# Patient Record
Sex: Female | Born: 1935 | Race: White | Hispanic: No | Marital: Married | Smoking: Never smoker
Health system: Southern US, Community
[De-identification: ages and names within clinical notes are randomized; demographics above are authoritative.]

## PROBLEM LIST (undated history)

## (undated) DIAGNOSIS — E079 Disorder of thyroid, unspecified: Secondary | ICD-10-CM

## (undated) DIAGNOSIS — I471 Supraventricular tachycardia: Secondary | ICD-10-CM

## (undated) DIAGNOSIS — R079 Chest pain, unspecified: Secondary | ICD-10-CM

## (undated) DIAGNOSIS — I10 Essential (primary) hypertension: Secondary | ICD-10-CM

## (undated) DIAGNOSIS — I517 Cardiomegaly: Secondary | ICD-10-CM

## (undated) DIAGNOSIS — R002 Palpitations: Secondary | ICD-10-CM

## (undated) DIAGNOSIS — N189 Chronic kidney disease, unspecified: Secondary | ICD-10-CM

## (undated) DIAGNOSIS — M1990 Unspecified osteoarthritis, unspecified site: Secondary | ICD-10-CM

## (undated) DIAGNOSIS — I491 Atrial premature depolarization: Secondary | ICD-10-CM

## (undated) DIAGNOSIS — N289 Disorder of kidney and ureter, unspecified: Secondary | ICD-10-CM

## (undated) DIAGNOSIS — E785 Hyperlipidemia, unspecified: Secondary | ICD-10-CM

## (undated) HISTORY — DX: Atrial premature depolarization: I49.1

## (undated) HISTORY — PX: JOINT REPLACEMENT: SHX530

## (undated) HISTORY — DX: Supraventricular tachycardia: I47.1

## (undated) HISTORY — DX: Hyperlipidemia, unspecified: E78.5

## (undated) HISTORY — PX: ABDOMINAL HYSTERECTOMY: SHX81

## (undated) HISTORY — DX: Chronic kidney disease, unspecified: N18.9

## (undated) HISTORY — DX: Cardiomegaly: I51.7

## (undated) HISTORY — DX: Unspecified osteoarthritis, unspecified site: M19.90

## (undated) HISTORY — DX: Chest pain, unspecified: R07.9

## (undated) HISTORY — PX: TOTAL KNEE ARTHROPLASTY: SHX125

## (undated) HISTORY — DX: Disorder of thyroid, unspecified: E07.9

## (undated) HISTORY — DX: Palpitations: R00.2

---

## 2000-07-23 ENCOUNTER — Other Ambulatory Visit: Admission: RE | Admit: 2000-07-23 | Discharge: 2000-07-23 | Payer: Self-pay | Admitting: Gynecology

## 2003-07-23 ENCOUNTER — Ambulatory Visit (HOSPITAL_COMMUNITY): Admission: RE | Admit: 2003-07-23 | Discharge: 2003-07-23 | Payer: Self-pay | Admitting: Orthopedic Surgery

## 2004-01-20 ENCOUNTER — Encounter (INDEPENDENT_AMBULATORY_CARE_PROVIDER_SITE_OTHER): Payer: Self-pay | Admitting: Specialist

## 2004-01-20 ENCOUNTER — Ambulatory Visit (HOSPITAL_COMMUNITY): Admission: RE | Admit: 2004-01-20 | Discharge: 2004-01-20 | Payer: Self-pay | Admitting: Gynecology

## 2004-10-23 ENCOUNTER — Other Ambulatory Visit: Admission: RE | Admit: 2004-10-23 | Discharge: 2004-10-23 | Payer: Self-pay | Admitting: Gynecology

## 2009-12-12 ENCOUNTER — Inpatient Hospital Stay (HOSPITAL_COMMUNITY): Admission: RE | Admit: 2009-12-12 | Discharge: 2009-12-14 | Payer: Self-pay | Admitting: Orthopedic Surgery

## 2010-07-28 LAB — CROSSMATCH

## 2010-07-28 LAB — CBC
HCT: 25.7 % — ABNORMAL LOW (ref 36.0–46.0)
HCT: 29.2 % — ABNORMAL LOW (ref 36.0–46.0)
HCT: 32.1 % — ABNORMAL LOW (ref 36.0–46.0)
Hemoglobin: 9.3 g/dL — ABNORMAL LOW (ref 12.0–15.0)
MCH: 29.2 pg (ref 26.0–34.0)
MCH: 29.6 pg (ref 26.0–34.0)
MCHC: 32.3 g/dL (ref 30.0–36.0)
MCHC: 33.3 g/dL (ref 30.0–36.0)
MCV: 91.5 fL (ref 78.0–100.0)
MCV: 91.8 fL (ref 78.0–100.0)
RBC: 3.19 MIL/uL — ABNORMAL LOW (ref 3.87–5.11)
RDW: 12.3 % (ref 11.5–15.5)
RDW: 13.3 % (ref 11.5–15.5)
WBC: 10.1 10*3/uL (ref 4.0–10.5)

## 2010-07-28 LAB — BASIC METABOLIC PANEL
BUN: 21 mg/dL (ref 6–23)
CO2: 25 mEq/L (ref 19–32)
Chloride: 110 mEq/L (ref 96–112)
GFR calc Af Amer: 28 mL/min — ABNORMAL LOW (ref >60)
GFR calc non Af Amer: 25 mL/min — ABNORMAL LOW (ref >60)
Glucose, Bld: 101 mg/dL — ABNORMAL HIGH (ref 70–99)
Potassium: 4.5 mEq/L (ref 3.5–5.1)
Potassium: 4.6 mEq/L (ref 3.5–5.1)
Sodium: 139 mEq/L (ref 135–145)

## 2010-07-29 LAB — COMPREHENSIVE METABOLIC PANEL
AST: 24 U/L (ref 0–37)
Albumin: 3.6 g/dL (ref 3.5–5.2)
Calcium: 9.8 mg/dL (ref 8.4–10.5)
Chloride: 109 mEq/L (ref 96–112)
Creatinine, Ser: 2.01 mg/dL — ABNORMAL HIGH (ref 0.4–1.2)
GFR calc Af Amer: 29 mL/min — ABNORMAL LOW (ref >60)
Total Protein: 6.5 g/dL (ref 6.0–8.3)

## 2010-07-29 LAB — URINALYSIS, ROUTINE W REFLEX MICROSCOPIC
Glucose, UA: NEGATIVE mg/dL
Leukocytes, UA: NEGATIVE
Nitrite: NEGATIVE
Protein, ur: 30 mg/dL — AB
pH: 6 (ref 5.0–8.0)

## 2010-07-29 LAB — URINE CULTURE: Culture: NO GROWTH

## 2010-07-29 LAB — CBC
MCH: 30.7 pg (ref 26.0–34.0)
MCHC: 33.7 g/dL (ref 30.0–36.0)
Platelets: 267 10*3/uL (ref 150–400)
RBC: 4.21 MIL/uL (ref 3.87–5.11)

## 2010-07-29 LAB — SURGICAL PCR SCREEN
MRSA, PCR: POSITIVE — AB
Staphylococcus aureus: POSITIVE — AB

## 2010-07-29 LAB — URINE MICROSCOPIC-ADD ON

## 2010-07-29 LAB — DIFFERENTIAL
Eosinophils Relative: 3 % (ref 0–5)
Lymphocytes Relative: 33 % (ref 12–46)
Lymphs Abs: 2.3 10*3/uL (ref 0.7–4.0)
Monocytes Absolute: 0.6 10*3/uL (ref 0.1–1.0)
Monocytes Relative: 9 % (ref 3–12)

## 2010-09-29 NOTE — Op Note (Signed)
NAME:  DENNY, MCDOWALL                       ACCOUNT NO.:  [id number]   MEDICAL RECORD NO.:  [id number]                   PATIENT TYPE:  AMB   LOCATION:  DAY                                  FACILITY:  [facility]   PHYSICIAN:  Gaynelle Arabian, M.D.                 DATE OF BIRTH:  22-Feb-1933   DATE OF PROCEDURE:  07/23/2003  DATE OF DISCHARGE:                                 OPERATIVE REPORT   PREOPERATIVE DIAGNOSES:  Right knee medial and lateral meniscal tears.   POSTOPERATIVE DIAGNOSES:  Right knee medial and lateral meniscal tears.   PROCEDURE:  Right knee arthroscopy with medial and lateral meniscal  debridement.   SURGEON:  Gaynelle Arabian, M.D.   ANESTHESIA:  Local with MAC.   ESTIMATED BLOOD LOSS:  Minimal.   DRAINS:  None.   COMPLICATIONS:  None.   CONDITION:  Stable to recovery.   BRIEF CLINICAL NOTE:  Ms. Maricle is a 77 year old female with a long  history of right knee pain and mechanical symptoms.  His exam and history  were consistent with lateral meniscal tear confirmed by MRI. She presents  now for arthroscopy and debridement.   DESCRIPTION OF PROCEDURE:  After successful administration of local with MAC  anesthetic, a tourniquet was placed high on the right thigh and right lower  extremity prepped and draped in the usual sterile fashion. I did infiltrate  the suprameatal portal with about 10 mL of 1% Xylocaine.  This incision was  made superomedially and inflow cannula passed in through the joint.  An  inferolateral incision was made and the camera passed into the joint.  Arthroscopic visualization proceeds. The suprapatellar area shows moderate  synovitis but no loose bodies.  The undersurface of the patella showed some  grade 2 change but no focal chondral defect. The trochlea had some grade 1  change but again no focal chondral defects. The medial and lateral gutters  showed some synovitis but no loose bodies.  Flexion and valgus force is  applied to the  knee and the medial compartment is entered. The spinal needle  was used to localize the inferomedial portal, small incisions made, dilator  placed and then a probe placed. There is a tear in the medial meniscus and  the posterior horn which is unstable. It is debrided back to a stable base  with baskets and a 4.2 mm shaver. There is a small area of chondral  delamination about 5 x 5 mm in the medial femoral condyle and I debrided  that back to a stable base. The intercondylar notch was visualized, the ACL  was attenuated and frayed but is not torn.  The lateral compartment is  entered and there is a large tear in the body and posterior horn of the  lateral meniscus. There is also some significant chondromalacia on the  lateral tibial plateau with focal area of a grade 4 defect. There is some  thinning in the lateral femoral condyle but no focal chondral defects. I  debrided the meniscal tear back to a stable base with baskets and the 4.2  shaver. I probed it and it was found to be stable.  The arthroscopic  equipment is then removed from the inferior portals which are closed with  interrupted 4-0 nylon.  20  mL of 0.25% Marcaine with epinephrine are injected through the inflow  cannula and then that cannula was removed and the portal closed with nylon.  A bulky sterile dressing was applied and she was awakened and transported to  recovery in stable condition.                                               Gaynelle Arabian, M.D.    FA/MEDQ  D:  07/23/2003  T:  07/23/2003  Job:  [id number]

## 2010-09-29 NOTE — Op Note (Signed)
NAME:  Morgan Rosales, Morgan Rosales                       ACCOUNT NO.:  [id number]   MEDICAL RECORD NO.:  [id number]                   PATIENT TYPE:  AMB   LOCATION:  SDC                                  FACILITY:  [facility]   PHYSICIAN:  Barbaraann Rondo, M.D.                DATE OF BIRTH:  19-Oct-1932   DATE OF PROCEDURE:  01/20/2004  DATE OF DISCHARGE:                                 OPERATIVE REPORT   PREOPERATIVE DIAGNOSIS:  Vulvar lesion, unknown etiology, rule out  carcinoma.   POSTOPERATIVE DIAGNOSIS:  Vulvar lesion, unknown etiology, rule out  carcinoma.   OPERATION PERFORMED:  Excisional biopsy of the vulva.   SURGEON:  Barbaraann Rondo, M.D.   ANESTHESIA:  MAC and local.   DESCRIPTION OF PROCEDURE:  The patient was placed in the lithotomy position  and sedated.  1% lidocaine was injected locally in the area of the lesion.  Excisional biopsy was done to remove the lesion that was visible right at  the fourchette.  A wide margin was excised in order to get it all removed in  toto, and sent to pathology for examination.  The defect was closed using  interrupted 4-0 Vicryl sutures.  The estimated blood loss was minimal, about  10 mL.  The patient tolerated the procedure well and was removed to recovery  in good condition.                                               Barbaraann Rondo, M.D.    WSB/MEDQ  D:  01/20/2004  T:  01/20/2004  Job:  [id number]

## 2014-07-06 DIAGNOSIS — E559 Vitamin D deficiency, unspecified: Secondary | ICD-10-CM | POA: Diagnosis not present

## 2014-07-06 DIAGNOSIS — N184 Chronic kidney disease, stage 4 (severe): Secondary | ICD-10-CM | POA: Diagnosis not present

## 2014-07-06 DIAGNOSIS — I10 Essential (primary) hypertension: Secondary | ICD-10-CM | POA: Diagnosis not present

## 2014-07-06 DIAGNOSIS — R399 Unspecified symptoms and signs involving the genitourinary system: Secondary | ICD-10-CM | POA: Diagnosis not present

## 2014-07-06 DIAGNOSIS — E039 Hypothyroidism, unspecified: Secondary | ICD-10-CM | POA: Diagnosis not present

## 2014-07-06 DIAGNOSIS — E785 Hyperlipidemia, unspecified: Secondary | ICD-10-CM | POA: Diagnosis not present

## 2014-07-15 DIAGNOSIS — N261 Atrophy of kidney (terminal): Secondary | ICD-10-CM | POA: Diagnosis not present

## 2014-07-15 DIAGNOSIS — N184 Chronic kidney disease, stage 4 (severe): Secondary | ICD-10-CM | POA: Diagnosis not present

## 2014-07-30 DIAGNOSIS — R42 Dizziness and giddiness: Secondary | ICD-10-CM | POA: Diagnosis not present

## 2014-07-30 DIAGNOSIS — F329 Major depressive disorder, single episode, unspecified: Secondary | ICD-10-CM | POA: Diagnosis not present

## 2014-07-30 DIAGNOSIS — M5136 Other intervertebral disc degeneration, lumbar region: Secondary | ICD-10-CM | POA: Diagnosis not present

## 2014-07-30 DIAGNOSIS — M5134 Other intervertebral disc degeneration, thoracic region: Secondary | ICD-10-CM | POA: Diagnosis not present

## 2014-07-30 DIAGNOSIS — R2689 Other abnormalities of gait and mobility: Secondary | ICD-10-CM | POA: Diagnosis not present

## 2014-08-25 DIAGNOSIS — H5203 Hypermetropia, bilateral: Secondary | ICD-10-CM | POA: Diagnosis not present

## 2014-08-25 DIAGNOSIS — H3531 Nonexudative age-related macular degeneration: Secondary | ICD-10-CM | POA: Diagnosis not present

## 2014-08-26 DIAGNOSIS — M4316 Spondylolisthesis, lumbar region: Secondary | ICD-10-CM | POA: Diagnosis not present

## 2014-08-26 DIAGNOSIS — M5136 Other intervertebral disc degeneration, lumbar region: Secondary | ICD-10-CM | POA: Diagnosis not present

## 2014-08-26 DIAGNOSIS — M5134 Other intervertebral disc degeneration, thoracic region: Secondary | ICD-10-CM | POA: Diagnosis not present

## 2014-09-08 DIAGNOSIS — M545 Low back pain: Secondary | ICD-10-CM | POA: Diagnosis not present

## 2014-09-14 DIAGNOSIS — M9972 Connective tissue and disc stenosis of intervertebral foramina of thoracic region: Secondary | ICD-10-CM | POA: Diagnosis not present

## 2014-09-14 DIAGNOSIS — M4804 Spinal stenosis, thoracic region: Secondary | ICD-10-CM | POA: Diagnosis not present

## 2014-09-14 DIAGNOSIS — M5134 Other intervertebral disc degeneration, thoracic region: Secondary | ICD-10-CM | POA: Diagnosis not present

## 2014-09-14 DIAGNOSIS — M5124 Other intervertebral disc displacement, thoracic region: Secondary | ICD-10-CM | POA: Diagnosis not present

## 2014-09-14 DIAGNOSIS — M47814 Spondylosis without myelopathy or radiculopathy, thoracic region: Secondary | ICD-10-CM | POA: Diagnosis not present

## 2014-09-15 DIAGNOSIS — M545 Low back pain: Secondary | ICD-10-CM | POA: Diagnosis not present

## 2014-09-29 DIAGNOSIS — N184 Chronic kidney disease, stage 4 (severe): Secondary | ICD-10-CM | POA: Diagnosis not present

## 2014-10-21 DIAGNOSIS — E039 Hypothyroidism, unspecified: Secondary | ICD-10-CM | POA: Diagnosis not present

## 2014-10-21 DIAGNOSIS — E785 Hyperlipidemia, unspecified: Secondary | ICD-10-CM | POA: Diagnosis not present

## 2014-10-21 DIAGNOSIS — Z9181 History of falling: Secondary | ICD-10-CM | POA: Diagnosis not present

## 2014-10-21 DIAGNOSIS — N184 Chronic kidney disease, stage 4 (severe): Secondary | ICD-10-CM | POA: Diagnosis not present

## 2014-10-21 DIAGNOSIS — Z139 Encounter for screening, unspecified: Secondary | ICD-10-CM | POA: Diagnosis not present

## 2014-10-21 DIAGNOSIS — I10 Essential (primary) hypertension: Secondary | ICD-10-CM | POA: Diagnosis not present

## 2014-10-21 DIAGNOSIS — E559 Vitamin D deficiency, unspecified: Secondary | ICD-10-CM | POA: Diagnosis not present

## 2014-10-21 DIAGNOSIS — Z1389 Encounter for screening for other disorder: Secondary | ICD-10-CM | POA: Diagnosis not present

## 2015-01-27 DIAGNOSIS — E785 Hyperlipidemia, unspecified: Secondary | ICD-10-CM | POA: Diagnosis not present

## 2015-01-27 DIAGNOSIS — E039 Hypothyroidism, unspecified: Secondary | ICD-10-CM | POA: Diagnosis not present

## 2015-01-27 DIAGNOSIS — N184 Chronic kidney disease, stage 4 (severe): Secondary | ICD-10-CM | POA: Diagnosis not present

## 2015-01-27 DIAGNOSIS — E559 Vitamin D deficiency, unspecified: Secondary | ICD-10-CM | POA: Diagnosis not present

## 2015-01-27 DIAGNOSIS — I10 Essential (primary) hypertension: Secondary | ICD-10-CM | POA: Diagnosis not present

## 2015-01-27 DIAGNOSIS — R3989 Other symptoms and signs involving the genitourinary system: Secondary | ICD-10-CM | POA: Diagnosis not present

## 2015-02-07 DIAGNOSIS — N184 Chronic kidney disease, stage 4 (severe): Secondary | ICD-10-CM | POA: Diagnosis not present

## 2015-02-22 DIAGNOSIS — I491 Atrial premature depolarization: Secondary | ICD-10-CM | POA: Diagnosis not present

## 2015-02-22 DIAGNOSIS — I517 Cardiomegaly: Secondary | ICD-10-CM

## 2015-02-22 DIAGNOSIS — R002 Palpitations: Secondary | ICD-10-CM | POA: Diagnosis not present

## 2015-02-22 DIAGNOSIS — I10 Essential (primary) hypertension: Secondary | ICD-10-CM | POA: Diagnosis not present

## 2015-02-22 HISTORY — DX: Cardiomegaly: I51.7

## 2015-02-25 DIAGNOSIS — R002 Palpitations: Secondary | ICD-10-CM | POA: Diagnosis not present

## 2015-02-28 DIAGNOSIS — R002 Palpitations: Secondary | ICD-10-CM | POA: Diagnosis not present

## 2015-03-24 DIAGNOSIS — Z23 Encounter for immunization: Secondary | ICD-10-CM | POA: Diagnosis not present

## 2015-03-24 DIAGNOSIS — N184 Chronic kidney disease, stage 4 (severe): Secondary | ICD-10-CM | POA: Diagnosis not present

## 2015-03-24 DIAGNOSIS — E559 Vitamin D deficiency, unspecified: Secondary | ICD-10-CM | POA: Diagnosis not present

## 2015-03-30 DIAGNOSIS — H04123 Dry eye syndrome of bilateral lacrimal glands: Secondary | ICD-10-CM | POA: Diagnosis not present

## 2015-03-30 DIAGNOSIS — H3530 Unspecified macular degeneration: Secondary | ICD-10-CM | POA: Diagnosis not present

## 2015-03-30 DIAGNOSIS — H02045 Spastic entropion of left lower eyelid: Secondary | ICD-10-CM | POA: Diagnosis not present

## 2015-03-30 DIAGNOSIS — H353130 Nonexudative age-related macular degeneration, bilateral, stage unspecified: Secondary | ICD-10-CM | POA: Diagnosis not present

## 2015-04-28 DIAGNOSIS — I10 Essential (primary) hypertension: Secondary | ICD-10-CM | POA: Diagnosis not present

## 2015-04-28 DIAGNOSIS — E559 Vitamin D deficiency, unspecified: Secondary | ICD-10-CM | POA: Diagnosis not present

## 2015-04-28 DIAGNOSIS — E039 Hypothyroidism, unspecified: Secondary | ICD-10-CM | POA: Diagnosis not present

## 2015-04-28 DIAGNOSIS — Z1389 Encounter for screening for other disorder: Secondary | ICD-10-CM | POA: Diagnosis not present

## 2015-04-28 DIAGNOSIS — E785 Hyperlipidemia, unspecified: Secondary | ICD-10-CM | POA: Diagnosis not present

## 2015-04-28 DIAGNOSIS — N184 Chronic kidney disease, stage 4 (severe): Secondary | ICD-10-CM | POA: Diagnosis not present

## 2015-04-29 DIAGNOSIS — H353130 Nonexudative age-related macular degeneration, bilateral, stage unspecified: Secondary | ICD-10-CM | POA: Diagnosis not present

## 2015-04-29 DIAGNOSIS — H3530 Unspecified macular degeneration: Secondary | ICD-10-CM | POA: Diagnosis not present

## 2015-04-29 DIAGNOSIS — H04123 Dry eye syndrome of bilateral lacrimal glands: Secondary | ICD-10-CM | POA: Diagnosis not present

## 2015-04-29 DIAGNOSIS — H02045 Spastic entropion of left lower eyelid: Secondary | ICD-10-CM | POA: Diagnosis not present

## 2015-05-04 DIAGNOSIS — R2689 Other abnormalities of gait and mobility: Secondary | ICD-10-CM | POA: Diagnosis not present

## 2015-05-04 DIAGNOSIS — F419 Anxiety disorder, unspecified: Secondary | ICD-10-CM | POA: Diagnosis not present

## 2015-05-04 DIAGNOSIS — Z6825 Body mass index (BMI) 25.0-25.9, adult: Secondary | ICD-10-CM | POA: Diagnosis not present

## 2015-05-04 DIAGNOSIS — S60511A Abrasion of right hand, initial encounter: Secondary | ICD-10-CM | POA: Diagnosis not present

## 2015-05-24 ENCOUNTER — Ambulatory Visit: Payer: Medicare Other | Admitting: Neurology

## 2015-05-25 ENCOUNTER — Encounter: Payer: Self-pay | Admitting: Neurology

## 2015-07-12 DIAGNOSIS — N390 Urinary tract infection, site not specified: Secondary | ICD-10-CM | POA: Diagnosis not present

## 2015-07-12 DIAGNOSIS — E039 Hypothyroidism, unspecified: Secondary | ICD-10-CM | POA: Diagnosis not present

## 2015-07-12 DIAGNOSIS — M545 Low back pain: Secondary | ICD-10-CM | POA: Diagnosis not present

## 2015-07-12 DIAGNOSIS — R61 Generalized hyperhidrosis: Secondary | ICD-10-CM | POA: Diagnosis not present

## 2015-08-02 DIAGNOSIS — E559 Vitamin D deficiency, unspecified: Secondary | ICD-10-CM | POA: Diagnosis not present

## 2015-08-02 DIAGNOSIS — E039 Hypothyroidism, unspecified: Secondary | ICD-10-CM | POA: Diagnosis not present

## 2015-08-02 DIAGNOSIS — I10 Essential (primary) hypertension: Secondary | ICD-10-CM | POA: Diagnosis not present

## 2015-08-02 DIAGNOSIS — E785 Hyperlipidemia, unspecified: Secondary | ICD-10-CM | POA: Diagnosis not present

## 2015-08-02 DIAGNOSIS — N184 Chronic kidney disease, stage 4 (severe): Secondary | ICD-10-CM | POA: Diagnosis not present

## 2015-08-03 DIAGNOSIS — H04123 Dry eye syndrome of bilateral lacrimal glands: Secondary | ICD-10-CM | POA: Diagnosis not present

## 2015-08-03 DIAGNOSIS — H353130 Nonexudative age-related macular degeneration, bilateral, stage unspecified: Secondary | ICD-10-CM | POA: Diagnosis not present

## 2015-09-28 DIAGNOSIS — R002 Palpitations: Secondary | ICD-10-CM | POA: Diagnosis not present

## 2015-10-07 DIAGNOSIS — M25551 Pain in right hip: Secondary | ICD-10-CM | POA: Diagnosis not present

## 2015-10-07 DIAGNOSIS — M25461 Effusion, right knee: Secondary | ICD-10-CM | POA: Diagnosis not present

## 2015-10-07 DIAGNOSIS — Z96651 Presence of right artificial knee joint: Secondary | ICD-10-CM | POA: Diagnosis not present

## 2015-10-07 DIAGNOSIS — M1611 Unilateral primary osteoarthritis, right hip: Secondary | ICD-10-CM | POA: Diagnosis not present

## 2015-10-27 DIAGNOSIS — R002 Palpitations: Secondary | ICD-10-CM | POA: Diagnosis not present

## 2015-11-08 DIAGNOSIS — I10 Essential (primary) hypertension: Secondary | ICD-10-CM | POA: Diagnosis not present

## 2015-11-08 DIAGNOSIS — Z9181 History of falling: Secondary | ICD-10-CM | POA: Diagnosis not present

## 2015-11-08 DIAGNOSIS — E663 Overweight: Secondary | ICD-10-CM | POA: Diagnosis not present

## 2015-11-08 DIAGNOSIS — E039 Hypothyroidism, unspecified: Secondary | ICD-10-CM | POA: Diagnosis not present

## 2015-11-08 DIAGNOSIS — N184 Chronic kidney disease, stage 4 (severe): Secondary | ICD-10-CM | POA: Diagnosis not present

## 2015-11-08 DIAGNOSIS — E785 Hyperlipidemia, unspecified: Secondary | ICD-10-CM | POA: Diagnosis not present

## 2015-11-17 DIAGNOSIS — M79606 Pain in leg, unspecified: Secondary | ICD-10-CM | POA: Diagnosis not present

## 2016-01-06 ENCOUNTER — Encounter (HOSPITAL_COMMUNITY): Payer: Self-pay | Admitting: *Deleted

## 2016-01-06 ENCOUNTER — Emergency Department (HOSPITAL_COMMUNITY)
Admission: EM | Admit: 2016-01-06 | Discharge: 2016-01-07 | Disposition: A | Discharge status: Home / Self Care | Payer: Medicare Other | Attending: Emergency Medicine | Admitting: Emergency Medicine

## 2016-01-06 DIAGNOSIS — Y929 Unspecified place or not applicable: Secondary | ICD-10-CM | POA: Insufficient documentation

## 2016-01-06 DIAGNOSIS — Y999 Unspecified external cause status: Secondary | ICD-10-CM | POA: Diagnosis not present

## 2016-01-06 DIAGNOSIS — Z23 Encounter for immunization: Secondary | ICD-10-CM | POA: Insufficient documentation

## 2016-01-06 DIAGNOSIS — N3946 Mixed incontinence: Secondary | ICD-10-CM | POA: Diagnosis not present

## 2016-01-06 DIAGNOSIS — W268XXA Contact with other sharp object(s), not elsewhere classified, initial encounter: Secondary | ICD-10-CM | POA: Insufficient documentation

## 2016-01-06 DIAGNOSIS — Y9301 Activity, walking, marching and hiking: Secondary | ICD-10-CM | POA: Diagnosis not present

## 2016-01-06 DIAGNOSIS — I10 Essential (primary) hypertension: Secondary | ICD-10-CM | POA: Diagnosis not present

## 2016-01-06 DIAGNOSIS — S81811A Laceration without foreign body, right lower leg, initial encounter: Secondary | ICD-10-CM | POA: Insufficient documentation

## 2016-01-06 DIAGNOSIS — Z6826 Body mass index (BMI) 26.0-26.9, adult: Secondary | ICD-10-CM | POA: Diagnosis not present

## 2016-01-06 HISTORY — DX: Essential (primary) hypertension: I10

## 2016-01-06 HISTORY — DX: Disorder of thyroid, unspecified: E07.9

## 2016-01-06 HISTORY — DX: Disorder of kidney and ureter, unspecified: N28.9

## 2016-01-06 MED ORDER — TETANUS-DIPHTH-ACELL PERTUSSIS 5-2.5-18.5 LF-MCG/0.5 IM SUSP
0.5000 mL | Freq: Once | INTRAMUSCULAR | Status: AC
  Administered 2016-01-07: 0.5 mL via INTRAMUSCULAR
  Filled 2016-01-06: qty 0.5

## 2016-01-06 NOTE — Discharge Instructions (Signed)
Please read and follow all provided instructions.  Your diagnoses today include:  1. Laceration of leg, right, initial encounter    Tests performed today include: Vital signs. See below for your results today.   Medications prescribed:   Take any prescribed medications only as directed.   Home care instructions:  Follow any educational materials and wound care instructions contained in this packet.   You may shower and wash the area with soap and water, just be sure to pat the area dry and not rub over the stitches. Do no put your stiches underwater (in a bath, pool, or lake). Getting stiches wet can slow down healing and increase your chances of getting an infection. You may apply Bacitracin or Neosporin twice a day for 7 days, and keep the ara clean with  bandage or gauze. Do not apply alcohol or hydrogen peroxide. Cover the area if it draining or weeping.   Follow-up instructions: Suture Removal: Return to the Emergency Department or see your primary care care doctor in 8-10 days for a recheck of your wound and removal of your sutures or staples.    Return instructions:  Return to the Emergency Department if you have: Fever Worsening pain Worsening swelling of the wound Pus draining from the wound Redness of the skin that moves away from the wound, especially if it streaks away from the affected area  Any other emergent concerns  Your vital signs today were: BP 188/71 (BP Location: Right Arm)    Pulse 66    Temp 97.8 F (36.6 C) (Oral)    Resp 18    Ht 5\' 2"  (1.575 m)    Wt 61.7 kg    SpO2 99%    BMI 24.87 kg/m  If your blood pressure (BP) was elevated above 135/85 this visit, please have this repeated by your doctor within one month. --------------

## 2016-01-06 NOTE — ED Triage Notes (Signed)
Pt states she cut her lateral lower leg at 8PM today on a screen door. Pt is not sure when she last had tetanus shot. Wound is bandaged, bleeding controlled.

## 2016-01-06 NOTE — ED Provider Notes (Signed)
[facility] DEPT Provider Note   CSN: [id number] Arrival date & time: 01/06/16  2107  By signing my name below, I, Reola Mosher, attest that this documentation has been prepared under the direction and in the presence of Shary Decamp, PA-C.  Electronically Signed: Reola Mosher, ED Scribe. 01/06/16. 10:45 PM.  History   Chief Complaint Chief Complaint  Patient presents with  . Laceration   Patient gave verbal permission to utilize photo for medical documentation only. The image was not stored on any personal device.  The history is provided by the patient. No language interpreter was used.   HPI Comments: Morgan Rosales is a 82 y.o. female with a PMHx of HTN, renal disorder, and thyroid disease, who presents to the Emergency Department complaining of sudden onset laceration to the lateral lower right leg that occurred approximately 2.5 hours PTA. Bleeding is controlled w/ pressure. Pt reports that she was walking out of a metal screen door when the bottom edge of the door came across her leg, sustaining her laceration. She is not currently on anticoagulants. Pt applied a pressure dressing prior to coming into the ED, but no other treatments were tried. She denies numbness, or any other associated symptoms. Tetanus is not UTD.   Past Medical History:  Diagnosis Date  . Hypertension   . Renal disorder   . Thyroid disease    There are no active problems to display for this patient.  Past Surgical History:  Procedure Laterality Date  . ABDOMINAL HYSTERECTOMY    . JOINT REPLACEMENT     OB History    No data available     Home Medications    Prior to Admission medications   Not on File   Family History No family history on file.  Social History Social History  Substance Use Topics  . Smoking status: Never Smoker  . Smokeless tobacco: Never Used  . Alcohol use No   Allergies   Codeine and Sulfa antibiotics  Review of Systems Review of Systems A  complete 10 system review of systems was obtained and all systems are negative except as noted in the HPI and PMH.   Physical Exam Updated Vital Signs BP 188/71 (BP Location: Right Arm)   Pulse 66   Temp 97.8 F (36.6 C) (Oral)   Resp 18   Ht 5\' 2"  (1.575 m)   Wt 136 lb (61.7 kg)   SpO2 99%   BMI 24.87 kg/m   Physical Exam  Constitutional: She appears well-developed and well-nourished.  HENT:  Head: Normocephalic.  Eyes: Conjunctivae are normal.  Cardiovascular: Normal rate.   Pulmonary/Chest: Effort normal. No respiratory distress.  Abdominal: She exhibits no distension.  Musculoskeletal: Normal range of motion.  Neurological: She is alert.  Skin: Skin is warm and dry.  See image.   Psychiatric: She has a normal mood and affect. Her behavior is normal.  Nursing note and vitals reviewed.    ED Treatments / Results  DIAGNOSTIC STUDIES: Oxygen Saturation is 99% on RA, normal by my interpretation.   COORDINATION OF CARE: 10:38 PM-Discussed next steps with pt and family at bedside including laceration repair. Pt verbalized understanding and is agreeable with the plan.   Labs (all labs ordered are listed, but only abnormal results are displayed) Labs Reviewed - No data to display  Procedures Procedures  PROCEDURE: LACERATION REPAIR  Performed by Shary Decamp, PA-C Consent: Informed consent, after discussion of the risks, benefits, and alternatives to the procedure, was obtained  Location: right lateral lower leg Length: 6cm Complexity: simple interrupted  Description: simple Distal CMS: Normal. No deficits. Neurovascularly intact. Anesthesia: Lido w/ epi 57mL Preparation: The wound was cleaned with betadine solution and irrigated with saline. The area was prepped and draped in the usual sterile fashion.  Exploration: The wound was explored and no foreign bodies were found. Procedure: The skin was closed with 4-0 proline. There was good approximation, In total, 10  were used.  Post-Procedure: Good closure and hemostasis. The patient tolerated the procedure well and there were no complications. CSM remain intact. Post procedure dressing applied by RN.    Medications Ordered in ED Medications - No data to display  Initial Impression / Assessment and Plan / ED Course  I have reviewed the triage vital signs and the nursing notes.  Pertinent labs & imaging results that were available during my care of the patient were reviewed by me and considered in my medical decision making (see chart for details).  Clinical Course     Final Clinical Impressions(s) / ED Diagnoses  I have reviewed the relevant previous healthcare records. I obtained HPI from historian. Patient discussed with supervising physician  ED Course:  Assessment: Patient is a 82yo female that presents with laceration to right lower leg. Tdap booster given. Pressure irrigation performed. Bottom of the wound visualized with bleeding controled. Laceration occurred < 8 hours prior to repair which was well tolerated. Pt has no co morbidities to effect normal wound healing. Discussed suture home care w pt and answered questions. Pt to f-u for wound check and suture removal in 8-10 days. Pt is hemodynamically stable w no complaints prior to dc.   Disposition/Plan:  DC Home Additional Verbal discharge instructions given and discussed with patient.  Pt Instructed to f/u with PCP in the next week for evaluation and treatment of symptoms. Return precautions given Pt acknowledges and agrees with plan  Supervising Physician Lacretia Leigh, MD   Final diagnoses:  Laceration of leg, right, initial encounter   New Prescriptions New Prescriptions   No medications on file    I personally performed the services described in this documentation, which was scribed in my presence. The recorded information has been reviewed and is accurate.     Shary Decamp, PA-C 01/06/16 2322    Lacretia Leigh,  MD 01/08/16 [phone]

## 2016-01-06 NOTE — ED Notes (Signed)
PA at bedside.

## 2016-01-06 NOTE — ED Notes (Signed)
Suture cart at bedside 

## 2016-01-07 MED ORDER — LIDOCAINE-EPINEPHRINE 2 %-1:100000 IJ SOLN
20.0000 mL | Freq: Once | INTRAMUSCULAR | Status: AC
  Administered 2016-01-07: 1 mL via INTRADERMAL

## 2016-01-07 NOTE — ED Notes (Signed)
Patient d/c'd self care.  F/U reviewed.  Patient verbalized understanding. 

## 2016-01-24 DIAGNOSIS — Z79899 Other long term (current) drug therapy: Secondary | ICD-10-CM | POA: Diagnosis not present

## 2016-01-24 DIAGNOSIS — S81801A Unspecified open wound, right lower leg, initial encounter: Secondary | ICD-10-CM | POA: Diagnosis not present

## 2016-02-07 DIAGNOSIS — C44722 Squamous cell carcinoma of skin of right lower limb, including hip: Secondary | ICD-10-CM | POA: Diagnosis not present

## 2016-02-13 DIAGNOSIS — N184 Chronic kidney disease, stage 4 (severe): Secondary | ICD-10-CM | POA: Diagnosis not present

## 2016-02-13 DIAGNOSIS — E039 Hypothyroidism, unspecified: Secondary | ICD-10-CM | POA: Diagnosis not present

## 2016-02-13 DIAGNOSIS — I129 Hypertensive chronic kidney disease with stage 1 through stage 4 chronic kidney disease, or unspecified chronic kidney disease: Secondary | ICD-10-CM | POA: Diagnosis not present

## 2016-02-13 DIAGNOSIS — Z23 Encounter for immunization: Secondary | ICD-10-CM | POA: Diagnosis not present

## 2016-02-13 DIAGNOSIS — E785 Hyperlipidemia, unspecified: Secondary | ICD-10-CM | POA: Diagnosis not present

## 2016-03-16 DIAGNOSIS — N184 Chronic kidney disease, stage 4 (severe): Secondary | ICD-10-CM | POA: Diagnosis not present

## 2016-04-11 DIAGNOSIS — Z1231 Encounter for screening mammogram for malignant neoplasm of breast: Secondary | ICD-10-CM | POA: Diagnosis not present

## 2016-05-16 DIAGNOSIS — N184 Chronic kidney disease, stage 4 (severe): Secondary | ICD-10-CM | POA: Diagnosis not present

## 2016-05-16 DIAGNOSIS — I129 Hypertensive chronic kidney disease with stage 1 through stage 4 chronic kidney disease, or unspecified chronic kidney disease: Secondary | ICD-10-CM | POA: Diagnosis not present

## 2016-05-16 DIAGNOSIS — E785 Hyperlipidemia, unspecified: Secondary | ICD-10-CM | POA: Diagnosis not present

## 2016-05-16 DIAGNOSIS — E039 Hypothyroidism, unspecified: Secondary | ICD-10-CM | POA: Diagnosis not present

## 2016-05-16 DIAGNOSIS — Z1389 Encounter for screening for other disorder: Secondary | ICD-10-CM | POA: Diagnosis not present

## 2016-07-06 DIAGNOSIS — M5134 Other intervertebral disc degeneration, thoracic region: Secondary | ICD-10-CM | POA: Diagnosis not present

## 2016-07-06 DIAGNOSIS — I129 Hypertensive chronic kidney disease with stage 1 through stage 4 chronic kidney disease, or unspecified chronic kidney disease: Secondary | ICD-10-CM | POA: Diagnosis not present

## 2016-07-06 DIAGNOSIS — M5136 Other intervertebral disc degeneration, lumbar region: Secondary | ICD-10-CM | POA: Diagnosis not present

## 2016-07-06 DIAGNOSIS — E039 Hypothyroidism, unspecified: Secondary | ICD-10-CM | POA: Diagnosis not present

## 2016-08-24 DIAGNOSIS — E039 Hypothyroidism, unspecified: Secondary | ICD-10-CM | POA: Diagnosis not present

## 2016-08-24 DIAGNOSIS — E785 Hyperlipidemia, unspecified: Secondary | ICD-10-CM | POA: Diagnosis not present

## 2016-08-24 DIAGNOSIS — I129 Hypertensive chronic kidney disease with stage 1 through stage 4 chronic kidney disease, or unspecified chronic kidney disease: Secondary | ICD-10-CM | POA: Diagnosis not present

## 2016-08-24 DIAGNOSIS — N184 Chronic kidney disease, stage 4 (severe): Secondary | ICD-10-CM | POA: Diagnosis not present

## 2016-09-06 DIAGNOSIS — Z23 Encounter for immunization: Secondary | ICD-10-CM | POA: Diagnosis not present

## 2016-09-06 DIAGNOSIS — S80812A Abrasion, left lower leg, initial encounter: Secondary | ICD-10-CM | POA: Diagnosis not present

## 2016-09-06 DIAGNOSIS — L03116 Cellulitis of left lower limb: Secondary | ICD-10-CM | POA: Diagnosis not present

## 2016-09-11 DIAGNOSIS — N184 Chronic kidney disease, stage 4 (severe): Secondary | ICD-10-CM | POA: Diagnosis not present

## 2016-11-30 DIAGNOSIS — M545 Low back pain: Secondary | ICD-10-CM | POA: Diagnosis not present

## 2016-11-30 DIAGNOSIS — I129 Hypertensive chronic kidney disease with stage 1 through stage 4 chronic kidney disease, or unspecified chronic kidney disease: Secondary | ICD-10-CM | POA: Diagnosis not present

## 2016-11-30 DIAGNOSIS — Z139 Encounter for screening, unspecified: Secondary | ICD-10-CM | POA: Diagnosis not present

## 2016-11-30 DIAGNOSIS — Z9181 History of falling: Secondary | ICD-10-CM | POA: Diagnosis not present

## 2016-11-30 DIAGNOSIS — N184 Chronic kidney disease, stage 4 (severe): Secondary | ICD-10-CM | POA: Diagnosis not present

## 2016-11-30 DIAGNOSIS — I700 Atherosclerosis of aorta: Secondary | ICD-10-CM | POA: Diagnosis not present

## 2016-11-30 DIAGNOSIS — M5136 Other intervertebral disc degeneration, lumbar region: Secondary | ICD-10-CM | POA: Diagnosis not present

## 2016-11-30 DIAGNOSIS — E785 Hyperlipidemia, unspecified: Secondary | ICD-10-CM | POA: Diagnosis not present

## 2016-11-30 DIAGNOSIS — E039 Hypothyroidism, unspecified: Secondary | ICD-10-CM | POA: Diagnosis not present

## 2016-11-30 DIAGNOSIS — M25551 Pain in right hip: Secondary | ICD-10-CM | POA: Diagnosis not present

## 2016-12-31 DIAGNOSIS — R002 Palpitations: Secondary | ICD-10-CM

## 2016-12-31 DIAGNOSIS — I10 Essential (primary) hypertension: Secondary | ICD-10-CM | POA: Insufficient documentation

## 2016-12-31 DIAGNOSIS — E079 Disorder of thyroid, unspecified: Secondary | ICD-10-CM

## 2016-12-31 DIAGNOSIS — R079 Chest pain, unspecified: Secondary | ICD-10-CM

## 2016-12-31 DIAGNOSIS — I471 Supraventricular tachycardia: Secondary | ICD-10-CM

## 2016-12-31 DIAGNOSIS — I491 Atrial premature depolarization: Secondary | ICD-10-CM

## 2016-12-31 DIAGNOSIS — N189 Chronic kidney disease, unspecified: Secondary | ICD-10-CM

## 2016-12-31 DIAGNOSIS — M1990 Unspecified osteoarthritis, unspecified site: Secondary | ICD-10-CM

## 2016-12-31 DIAGNOSIS — E785 Hyperlipidemia, unspecified: Secondary | ICD-10-CM

## 2016-12-31 HISTORY — DX: Chronic kidney disease, unspecified: N18.9

## 2016-12-31 HISTORY — DX: Unspecified osteoarthritis, unspecified site: M19.90

## 2016-12-31 HISTORY — DX: Chest pain, unspecified: R07.9

## 2016-12-31 HISTORY — DX: Supraventricular tachycardia: I47.1

## 2016-12-31 HISTORY — DX: Palpitations: R00.2

## 2016-12-31 HISTORY — DX: Disorder of thyroid, unspecified: E07.9

## 2016-12-31 HISTORY — DX: Hyperlipidemia, unspecified: E78.5

## 2016-12-31 HISTORY — DX: Atrial premature depolarization: I49.1

## 2017-01-01 ENCOUNTER — Ambulatory Visit: Payer: Medicare Other | Admitting: Cardiology

## 2017-01-01 DIAGNOSIS — M1712 Unilateral primary osteoarthritis, left knee: Secondary | ICD-10-CM | POA: Diagnosis not present

## 2017-01-01 DIAGNOSIS — M1711 Unilateral primary osteoarthritis, right knee: Secondary | ICD-10-CM | POA: Diagnosis not present

## 2017-01-01 DIAGNOSIS — M25562 Pain in left knee: Secondary | ICD-10-CM | POA: Diagnosis not present

## 2017-01-01 DIAGNOSIS — M2342 Loose body in knee, left knee: Secondary | ICD-10-CM | POA: Diagnosis not present

## 2017-01-01 NOTE — Progress Notes (Deleted)
Cardiology Office Note:    Date:  01/01/2017   ID:  Morgan Rosales, DOB Mar 22, 1933, MRN [id number]  PCP:  No primary care provider on file.  Cardiologist:  Shirlee More, MD    Referring MD: No ref. provider found    ASSESSMENT:    No diagnosis found. PLAN:    In order of problems listed above:  1. ***   Next appointment: ***   Medication Adjustments/Labs and Tests Ordered: Current medicines are reviewed at length with the patient today.  Concerns regarding medicines are outlined above.  No orders of the defined types were placed in this encounter.  No orders of the defined types were placed in this encounter.   No chief complaint on file.   History of Present Illness:    Morgan Rosales is a 83 y.o. female with a hx of HTN with LVH, Palpitations and frequent and repetitive APC's last seen in October 2016. Compliance with diet, lifestyle and medications: *** Past Medical History:  Diagnosis Date  . Arthritis 12/31/2016  . Atrial premature depolarization 12/31/2016  . Chest pain 12/31/2016  . Chronic kidney disease 12/31/2016  . Disease of thyroid gland 12/31/2016  . Hyperlipidemia 12/31/2016  . Hypertension   . Mild concentric left ventricular hypertrophy (LVH) 02/22/2015  . Palpitations 12/31/2016  . Paroxysmal atrial tachycardia ([place]) 12/31/2016  . Renal disorder   . Thyroid disease     Past Surgical History:  Procedure Laterality Date  . ABDOMINAL HYSTERECTOMY    . JOINT REPLACEMENT      Current Medications: No outpatient prescriptions have been marked as taking for the 01/01/17 encounter (Appointment) with Richardo Priest, MD.     Allergies:   Codeine and Sulfa antibiotics   Social History   Social History  . Marital status: Married    Spouse name: N/A  . Number of children: N/A  . Years of education: N/A   Social History Main Topics  . Smoking status: Never Smoker  . Smokeless tobacco: Never Used  . Alcohol use No  . Drug use: No  .  Sexual activity: Not on file   Other Topics Concern  . Not on file   Social History Narrative  . No narrative on file     Family History: The patient's ***family history includes Diabetes in her mother; Heart disease in her mother. ROS:   Please see the history of present illness.    All other systems reviewed and are negative.  EKGs/Labs/Other Studies Reviewed:    The following studies were reviewed today: Event monitor: Interpreting Physician: Richardo Priest, MD [facility] Date:09/28/15-10/27/15  Indication: palpitations  Baseline Rhythm: sinus bradycardia at 43 BPM  Rhythm Findings:  1.Ventricular: No PVCs  2.Supraventricular: infrequent PACs  3.Bradyarrhythmias and Pauses: 2 asymptomatic episodes of sinus bradycardia < 50 BPM  Symptoms reported:None  CONCLUSIONS: 1.Abnormal event monitor.  2.Arrhythmia as described 3.Symptoms were not reported  4.Symptoms were not correlated with arrhythmias EKG:  EKG ordered today.  The ekg ordered today demonstrates ***  Recent Labs: No results found for requested labs within last 8760 hours.  Recent Lipid Panel No results found for: CHOL, TRIG, HDL, CHOLHDL, VLDL, LDLCALC, LDLDIRECT  Physical Exam:    VS:  There were no vitals taken for this visit.    Wt Readings from Last 3 Encounters:  01/06/16 136 lb (61.7 kg)     GEN: *** Well nourished, well developed in no acute distress HEENT: Normal NECK: No JVD; No carotid bruits LYMPHATICS:  No lymphadenopathy CARDIAC: ***RRR, no murmurs, rubs, gallops RESPIRATORY:  Clear to auscultation without rales, wheezing or rhonchi  ABDOMEN: Soft, non-tender, non-distended MUSCULOSKELETAL:  No edema; No deformity  SKIN: Warm and dry NEUROLOGIC:  Alert and oriented x 3 PSYCHIATRIC:  Normal affect    Signed, Shirlee More, MD  01/01/2017 2:32 PM    [facility]

## 2017-03-08 DIAGNOSIS — E785 Hyperlipidemia, unspecified: Secondary | ICD-10-CM | POA: Diagnosis not present

## 2017-03-08 DIAGNOSIS — E039 Hypothyroidism, unspecified: Secondary | ICD-10-CM | POA: Diagnosis not present

## 2017-03-08 DIAGNOSIS — Z139 Encounter for screening, unspecified: Secondary | ICD-10-CM | POA: Diagnosis not present

## 2017-03-08 DIAGNOSIS — I129 Hypertensive chronic kidney disease with stage 1 through stage 4 chronic kidney disease, or unspecified chronic kidney disease: Secondary | ICD-10-CM | POA: Diagnosis not present

## 2017-03-08 DIAGNOSIS — N184 Chronic kidney disease, stage 4 (severe): Secondary | ICD-10-CM | POA: Diagnosis not present

## 2017-03-08 DIAGNOSIS — R7301 Impaired fasting glucose: Secondary | ICD-10-CM | POA: Diagnosis not present

## 2017-03-29 DIAGNOSIS — C44529 Squamous cell carcinoma of skin of other part of trunk: Secondary | ICD-10-CM | POA: Diagnosis not present

## 2017-04-02 DIAGNOSIS — R61 Generalized hyperhidrosis: Secondary | ICD-10-CM | POA: Diagnosis not present

## 2017-04-02 DIAGNOSIS — G309 Alzheimer's disease, unspecified: Secondary | ICD-10-CM | POA: Diagnosis not present

## 2017-05-01 DIAGNOSIS — H353132 Nonexudative age-related macular degeneration, bilateral, intermediate dry stage: Secondary | ICD-10-CM | POA: Diagnosis not present

## 2017-05-01 DIAGNOSIS — H35371 Puckering of macula, right eye: Secondary | ICD-10-CM | POA: Diagnosis not present

## 2017-05-18 DIAGNOSIS — G309 Alzheimer's disease, unspecified: Secondary | ICD-10-CM | POA: Diagnosis not present

## 2017-05-18 DIAGNOSIS — R413 Other amnesia: Secondary | ICD-10-CM | POA: Diagnosis not present

## 2017-05-18 DIAGNOSIS — K1379 Other lesions of oral mucosa: Secondary | ICD-10-CM | POA: Diagnosis not present

## 2017-06-05 DIAGNOSIS — N184 Chronic kidney disease, stage 4 (severe): Secondary | ICD-10-CM | POA: Diagnosis not present

## 2017-07-30 DIAGNOSIS — M7542 Impingement syndrome of left shoulder: Secondary | ICD-10-CM | POA: Diagnosis not present

## 2017-07-30 DIAGNOSIS — M25512 Pain in left shoulder: Secondary | ICD-10-CM | POA: Diagnosis not present

## 2017-07-30 DIAGNOSIS — M19012 Primary osteoarthritis, left shoulder: Secondary | ICD-10-CM | POA: Diagnosis not present

## 2017-08-08 DIAGNOSIS — M2041 Other hammer toe(s) (acquired), right foot: Secondary | ICD-10-CM | POA: Diagnosis not present

## 2017-08-08 DIAGNOSIS — B351 Tinea unguium: Secondary | ICD-10-CM | POA: Diagnosis not present

## 2017-08-08 DIAGNOSIS — M2042 Other hammer toe(s) (acquired), left foot: Secondary | ICD-10-CM | POA: Diagnosis not present

## 2017-08-14 DIAGNOSIS — L821 Other seborrheic keratosis: Secondary | ICD-10-CM | POA: Diagnosis not present

## 2017-08-14 DIAGNOSIS — Z1331 Encounter for screening for depression: Secondary | ICD-10-CM | POA: Diagnosis not present

## 2017-08-14 DIAGNOSIS — I129 Hypertensive chronic kidney disease with stage 1 through stage 4 chronic kidney disease, or unspecified chronic kidney disease: Secondary | ICD-10-CM | POA: Diagnosis not present

## 2017-08-14 DIAGNOSIS — E039 Hypothyroidism, unspecified: Secondary | ICD-10-CM | POA: Diagnosis not present

## 2017-08-14 DIAGNOSIS — E785 Hyperlipidemia, unspecified: Secondary | ICD-10-CM | POA: Diagnosis not present

## 2017-08-14 DIAGNOSIS — N184 Chronic kidney disease, stage 4 (severe): Secondary | ICD-10-CM | POA: Diagnosis not present

## 2017-10-09 DIAGNOSIS — N184 Chronic kidney disease, stage 4 (severe): Secondary | ICD-10-CM | POA: Diagnosis not present

## 2017-10-16 DIAGNOSIS — N184 Chronic kidney disease, stage 4 (severe): Secondary | ICD-10-CM | POA: Diagnosis not present

## 2017-10-22 DIAGNOSIS — E875 Hyperkalemia: Secondary | ICD-10-CM | POA: Diagnosis not present

## 2017-10-22 DIAGNOSIS — N184 Chronic kidney disease, stage 4 (severe): Secondary | ICD-10-CM | POA: Diagnosis not present

## 2017-11-22 DIAGNOSIS — Z1339 Encounter for screening examination for other mental health and behavioral disorders: Secondary | ICD-10-CM | POA: Diagnosis not present

## 2017-11-22 DIAGNOSIS — I129 Hypertensive chronic kidney disease with stage 1 through stage 4 chronic kidney disease, or unspecified chronic kidney disease: Secondary | ICD-10-CM | POA: Diagnosis not present

## 2017-11-22 DIAGNOSIS — E039 Hypothyroidism, unspecified: Secondary | ICD-10-CM | POA: Diagnosis not present

## 2017-11-22 DIAGNOSIS — N184 Chronic kidney disease, stage 4 (severe): Secondary | ICD-10-CM | POA: Diagnosis not present

## 2017-11-22 DIAGNOSIS — E785 Hyperlipidemia, unspecified: Secondary | ICD-10-CM | POA: Diagnosis not present

## 2017-11-26 DIAGNOSIS — M542 Cervicalgia: Secondary | ICD-10-CM | POA: Diagnosis not present

## 2017-11-26 DIAGNOSIS — M4722 Other spondylosis with radiculopathy, cervical region: Secondary | ICD-10-CM | POA: Diagnosis not present

## 2017-11-26 DIAGNOSIS — M25511 Pain in right shoulder: Secondary | ICD-10-CM | POA: Diagnosis not present

## 2017-11-26 DIAGNOSIS — M19011 Primary osteoarthritis, right shoulder: Secondary | ICD-10-CM | POA: Diagnosis not present

## 2017-11-26 DIAGNOSIS — G309 Alzheimer's disease, unspecified: Secondary | ICD-10-CM | POA: Diagnosis not present

## 2017-11-26 DIAGNOSIS — M79601 Pain in right arm: Secondary | ICD-10-CM | POA: Diagnosis not present

## 2017-12-02 DIAGNOSIS — M7542 Impingement syndrome of left shoulder: Secondary | ICD-10-CM | POA: Diagnosis not present

## 2017-12-02 DIAGNOSIS — M19012 Primary osteoarthritis, left shoulder: Secondary | ICD-10-CM | POA: Diagnosis not present

## 2017-12-02 DIAGNOSIS — M5030 Other cervical disc degeneration, unspecified cervical region: Secondary | ICD-10-CM | POA: Diagnosis not present

## 2017-12-02 DIAGNOSIS — M5412 Radiculopathy, cervical region: Secondary | ICD-10-CM | POA: Diagnosis not present

## 2017-12-06 DIAGNOSIS — M4803 Spinal stenosis, cervicothoracic region: Secondary | ICD-10-CM | POA: Diagnosis not present

## 2017-12-06 DIAGNOSIS — M5412 Radiculopathy, cervical region: Secondary | ICD-10-CM | POA: Diagnosis not present

## 2017-12-06 DIAGNOSIS — M5023 Other cervical disc displacement, cervicothoracic region: Secondary | ICD-10-CM | POA: Diagnosis not present

## 2018-02-06 DIAGNOSIS — Z1231 Encounter for screening mammogram for malignant neoplasm of breast: Secondary | ICD-10-CM | POA: Diagnosis not present

## 2018-02-28 DIAGNOSIS — Z9181 History of falling: Secondary | ICD-10-CM | POA: Diagnosis not present

## 2018-02-28 DIAGNOSIS — N184 Chronic kidney disease, stage 4 (severe): Secondary | ICD-10-CM | POA: Diagnosis not present

## 2018-02-28 DIAGNOSIS — E039 Hypothyroidism, unspecified: Secondary | ICD-10-CM | POA: Diagnosis not present

## 2018-02-28 DIAGNOSIS — E785 Hyperlipidemia, unspecified: Secondary | ICD-10-CM | POA: Diagnosis not present

## 2018-02-28 DIAGNOSIS — I129 Hypertensive chronic kidney disease with stage 1 through stage 4 chronic kidney disease, or unspecified chronic kidney disease: Secondary | ICD-10-CM | POA: Diagnosis not present

## 2018-03-24 DIAGNOSIS — H26493 Other secondary cataract, bilateral: Secondary | ICD-10-CM | POA: Diagnosis not present

## 2018-03-24 DIAGNOSIS — H35371 Puckering of macula, right eye: Secondary | ICD-10-CM | POA: Diagnosis not present

## 2018-03-24 DIAGNOSIS — H3530 Unspecified macular degeneration: Secondary | ICD-10-CM | POA: Diagnosis not present

## 2018-05-15 DIAGNOSIS — L814 Other melanin hyperpigmentation: Secondary | ICD-10-CM | POA: Diagnosis not present

## 2018-05-15 DIAGNOSIS — L820 Inflamed seborrheic keratosis: Secondary | ICD-10-CM | POA: Diagnosis not present

## 2018-06-19 DIAGNOSIS — E039 Hypothyroidism, unspecified: Secondary | ICD-10-CM | POA: Diagnosis not present

## 2018-06-19 DIAGNOSIS — E785 Hyperlipidemia, unspecified: Secondary | ICD-10-CM | POA: Diagnosis not present

## 2018-06-19 DIAGNOSIS — I129 Hypertensive chronic kidney disease with stage 1 through stage 4 chronic kidney disease, or unspecified chronic kidney disease: Secondary | ICD-10-CM | POA: Diagnosis not present

## 2018-06-19 DIAGNOSIS — N184 Chronic kidney disease, stage 4 (severe): Secondary | ICD-10-CM | POA: Diagnosis not present

## 2018-07-08 ENCOUNTER — Ambulatory Visit: Payer: Medicare Other | Admitting: Cardiology

## 2018-07-08 VITALS — BP 172/80 | HR 57 | Ht 61.0 in | Wt 133.1 lb

## 2018-07-08 DIAGNOSIS — N189 Chronic kidney disease, unspecified: Secondary | ICD-10-CM

## 2018-07-08 DIAGNOSIS — I10 Essential (primary) hypertension: Secondary | ICD-10-CM | POA: Diagnosis not present

## 2018-07-08 DIAGNOSIS — I517 Cardiomegaly: Secondary | ICD-10-CM | POA: Diagnosis not present

## 2018-07-08 DIAGNOSIS — R001 Bradycardia, unspecified: Secondary | ICD-10-CM

## 2018-07-08 MED ORDER — HYDRALAZINE HCL 25 MG PO TABS: 25.0000 mg | ORAL_TABLET | Freq: Two times a day (BID) | ORAL | 2 refills | Status: AC

## 2018-07-08 NOTE — Patient Instructions (Addendum)
Medication Instructions:  Your physician has recommended you make the following change in your medication:   STOP taking atenolol START taking hydralazine 25 mg (1 tablet) two times per day  If you need a refill on your cardiac medications before your next appointment, please call your pharmacy.   Lab work: NONE  Testing/Procedures: NONE  Follow-Up: At [facility], you and your health needs are our priority.  As part of our continuing mission to provide you with exceptional heart care, we have created designated Provider Care Teams.  These Care Teams include your primary Cardiologist (physician) and Advanced Practice Providers (APPs -  Physician Assistants and Nurse Practitioners) who all work together to provide you with the care you need, when you need it. You will need a follow up appointment in 1 months.   Any Other Special Instructions Will Be Listed Below  Your physician has requested that you regularly monitor and record your heart rate and blood pressure readings at home. Please use the same machine at the same time of day to check your readings and record them to bring to your follow-up visit.       [email or web address] [email or web address]        FDA-cleared, clinical grade mobile EKG monitor: Jodelle Red is the most clinically-validated mobile EKG used by the world's leading cardiac care medical professionals With Basic service, know instantly if your heart rhythm is normal or if atrial fibrillation is detected, and email the last single EKG recording to yourself or your doctor Premium service, available for purchase through the Kardia app for $9.99 per month or $99 per year, includes unlimited history and storage of your EKG recordings, a monthly EKG summary report to share with your doctor, along with the ability to track your blood pressure, activity and weight Includes one KardiaMobile phone clip FREE SHIPPING: Standard delivery 1-3 business days.  Orders placed by 11:00am PST will ship that afternoon. Otherwise, will ship next business day. All orders ship via [employer] from [place], [place]   Hydralazine tablets What is this medicine? HYDRALAZINE (hye DRAL a zeen) is a type of vasodilator. It relaxes blood vessels, increasing the blood and oxygen supply to your heart. This medicine is used to treat high blood pressure. This medicine may be used for other purposes; ask your health care provider or pharmacist if you have questions. COMMON BRAND NAME(S): Apresoline What should I tell my health care provider before I take this medicine? They need to know if you have any of these conditions: -blood vessel disease -heart disease including angina or history of heart attack -kidney or liver disease -systemic lupus erythematosus (SLE) -an unusual or allergic reaction to hydralazine, tartrazine dye, other medicines, foods, dyes, or preservatives -pregnant or trying to get pregnant -breast-feeding How should I use this medicine? Take this medicine by mouth with a glass of water. Follow the directions on the prescription label. Take your doses at regular intervals. Do not take your medicine more often than directed. Do not stop taking except on the advice of your doctor or health care professional. Talk to your pediatrician regarding the use of this medicine in children. Special care may be needed. While this drug may be prescribed for children for selected conditions, precautions do apply. Overdosage: If you think you have taken too much of this medicine contact a poison control center or emergency room at once. NOTE: This medicine is only for you. Do not share this medicine with others. What if I miss a dose? If  you miss a dose, take it as soon as you can. If it is almost time for your next dose, take only that dose. Do not take double or extra doses. What may interact with this medicine? -medicines for high blood pressure -medicines for  mental depression This list may not describe all possible interactions. Give your health care provider a list of all the medicines, herbs, non-prescription drugs, or dietary supplements you use. Also tell them if you smoke, drink alcohol, or use illegal drugs. Some items may interact with your medicine. What should I watch for while using this medicine? Visit your doctor or health care professional for regular checks on your progress. Check your blood pressure and pulse rate regularly. Ask your doctor or health care professional what your blood pressure and pulse rate should be and when you should contact him or her. You may get drowsy or dizzy. Do not drive, use machinery, or do anything that needs mental alertness until you know how this medicine affects you. Do not stand or sit up quickly, especially if you are an older patient. This reduces the risk of dizzy or fainting spells. Alcohol may interfere with the effect of this medicine. Avoid alcoholic drinks. Do not treat yourself for coughs, colds, or pain while you are taking this medicine without asking your doctor or health care professional for advice. Some ingredients may increase your blood pressure. What side effects may I notice from receiving this medicine? Side effects that you should report to your doctor or health care professional as soon as possible: -chest pain, or fast or irregular heartbeat -fever, chills, or sore throat -numbness or tingling in the hands or feet -shortness of breath -skin rash, redness, blisters or itching -stiff or swollen joints -sudden weight gain -swelling of the feet or legs -swollen lymph glands -unusual weakness Side effects that usually do not require medical attention (report to your doctor or health care professional if they continue or are bothersome): -diarrhea, or constipation -headache -loss of appetite -nausea, vomiting This list may not describe all possible side effects. Call your doctor  for medical advice about side effects. You may report side effects to FDA at 1-800-FDA-1088. Where should I keep my medicine? Keep out of the reach of children. Store at room temperature between 15 and 30 degrees C (59 and 86 degrees F). Throw away any unused medicine after the expiration date. NOTE: This sheet is a summary. It may not cover all possible information. If you have questions about this medicine, talk to your doctor, pharmacist, or health care provider.  2019 Elsevier/Gold Standard (2007-09-12 15:44:58)

## 2018-07-08 NOTE — Addendum Note (Signed)
Addended by: Beckey Rutter on: 07/08/2018 04:32 PM   Modules accepted: Orders

## 2018-07-08 NOTE — Progress Notes (Signed)
Cardiology Office Note:    Date:  07/08/2018   ID:  Morgan Rosales, DOB 1932/08/22, MRN [id number]  PCP:  Morgan Dress, MD  Cardiologist:  Morgan More, MD    Referring MD: No ref. provider found    ASSESSMENT:    1. Bradycardia   2. Essential hypertension   3. Chronic kidney disease, unspecified CKD stage   4. Mild concentric left ventricular hypertrophy (LVH)    PLAN:    In order of problems listed above:  1. Despite the absence of syncope heart rate of 42 warrants discontinuing beta-blocker avoiding medications that were concerned CKD continue her calcium channel blocker non-rate limiting and add hydralazine for reflex tachycardia and she is heart rate blood pressure goals.  Her daughter to monitor heart rhythm at home with a pulse meter and the iPhone adapter to record rhythm strips.  At this time I do not think she will require pacemaker unless we have ongoing bradycardia less than 50 I would not apply an outpatient heart rhythm monitor at this time. 2. Not well controlled continue calcium channel blocker and add hydralazine. 3. Stable she has not progressed from CKD avoid diuretics ACE and ARB if possible. 4. Her EKG is typical of ventricular pertinently at this time I would not do an ischemia evaluation we will repeat echocardiogram.   Next appointment: 1 month   Medication Adjustments/Labs and Tests Ordered: Current medicines are reviewed at length with the patient today.  Concerns regarding medicines are outlined above.  No orders of the defined types were placed in this encounter.  Meds ordered this encounter  Medications  . hydrALAZINE (APRESOLINE) 25 MG tablet    Sig: Take 1 tablet (25 mg total) by mouth 2 (two) times daily.    Dispense:  60 tablet    Refill:  2    No chief complaint on file.   History of Present Illness:    Morgan Rosales is a 85 y.o. female with a hx of hypertension mild LVH stage 4 CKD GFR 25 cc/min  8 yrs ago and palpitation  with APC's last seen 02/22/15 at [facility] cardiology.. An event monitor performed 12/29/2016 showed sinus rhythm throughout 2 episodes asymptomatic of sinus bradycardia less than 50 bpm and infrequent atrial premature beats.  She is seen today due to concerns of bradycardia with heart rates in the 50s along with weakness and fatigue Compliance with diet, lifestyle and medications: yes  Her daughter is present she is a Marine scientist.  Her mother had a recent visit heart rate 42 atenolol discontinued blood pressure is elevated and she is restarted.  She has had days where she is tired and fatigued but she has not had syncope or near syncope and no recurrent rates of less than 50 since that date.  She has a background history of bradycardia and atrial arrhythmia I think it is time for her to stop beta-blocker and will use medications as she blood pressure goal avoiding worsening her chronic stage IV CKD.  She has had no syncope lightheadedness chest pain shortness of breath or TIA.  She takes no over-the-counter medications affecting her heart rhythm. Past Medical History:  Diagnosis Date  . Arthritis 12/31/2016  . Atrial premature depolarization 12/31/2016  . Chest pain 12/31/2016  . Chronic kidney disease 12/31/2016  . Disease of thyroid gland 12/31/2016  . Hyperlipidemia 12/31/2016  . Hypertension   . Mild concentric left ventricular hypertrophy (LVH) 02/22/2015  . Palpitations 12/31/2016  . Paroxysmal atrial tachycardia (  [place]) 12/31/2016  . Renal disorder   . Thyroid disease     Past Surgical History:  Procedure Laterality Date  . ABDOMINAL HYSTERECTOMY    . JOINT REPLACEMENT    . TOTAL KNEE ARTHROPLASTY      Current Medications: Current Meds  Medication Sig  . DULoxetine (CYMBALTA) 60 MG capsule TAKE 1 CAPSULE BY MOUTH ONCE DAILY WITH FOOD  . Fluticasone Furoate 50 MCG/ACT AEPB Inhale 1 puff into the lungs daily.  Marland Kitchen levothyroxine (SYNTHROID, LEVOTHROID) 88 MCG tablet Take 88 mcg by mouth daily.  .  memantine (NAMENDA) 5 MG tablet Take 5 mg by mouth 2 (two) times daily.  Marland Kitchen NIFEdipine (ADALAT CC) 60 MG 24 hr tablet Take 60 mg by mouth daily.  . Omega-3 Fatty Acids (FISH OIL) 1000 MG CAPS Take 1 capsule by mouth daily.  . pantoprazole (PROTONIX) 40 MG tablet Take 40 mg by mouth 2 (two) times daily.  . traMADol (ULTRAM) 50 MG tablet Take 1 tablet by mouth every 6 (six) hours as needed.  . [DISCONTINUED] atenolol (TENORMIN) 25 MG tablet Take 12.5 mg by mouth daily.     Allergies:   Codeine; Ezetimibe; Niacin; and Sulfa antibiotics   Social History   Socioeconomic History  . Marital status: Married    Spouse name: Not on file  . Number of children: Not on file  . Years of education: Not on file  . Highest education level: Not on file  Occupational History  . Not on file  Social Needs  . Financial resource strain: Not on file  . Food insecurity:    Worry: Not on file    Inability: Not on file  . Transportation needs:    Medical: Not on file    Non-medical: Not on file  Tobacco Use  . Smoking status: Never Smoker  . Smokeless tobacco: Never Used  Substance and Sexual Activity  . Alcohol use: No  . Drug use: No  . Sexual activity: Not on file  Lifestyle  . Physical activity:    Days per week: Not on file    Minutes per session: Not on file  . Stress: Not on file  Relationships  . Social connections:    Talks on phone: Not on file    Gets together: Not on file    Attends religious service: Not on file    Active member of club or organization: Not on file    Attends meetings of clubs or organizations: Not on file    Relationship status: Not on file  Other Topics Concern  . Not on file  Social History Narrative  . Not on file     Family History: The patient's family history includes Diabetes in her mother; Heart disease in her mother. ROS:  Review of Systems  Constitution: Positive for malaise/fatigue.  HENT: Negative.   Eyes: Negative.   Cardiovascular:  Negative.   Respiratory: Negative.   Endocrine: Negative.   Hematologic/Lymphatic: Negative.   Skin: Negative.   Musculoskeletal: Negative.   Gastrointestinal: Negative.   Genitourinary: Negative.   Neurological: Negative.   Psychiatric/Behavioral: Negative.   Allergic/Immunologic: Negative.    Please see the history of present illness.    All other systems reviewed and are negative.  EKGs/Labs/Other Studies Reviewed:    The following studies were reviewed today:  EKG:  EKG ordered today.  The ekg ordered today demonstrates [facility] LVH repolarization  Recent Labs: Recent labs with her PCP showed a stable creatinine of 2.29. No results  found for requested labs within last 8760 hours.  Recent Lipid Panel No results found for: CHOL, TRIG, HDL, CHOLHDL, VLDL, LDLCALC, LDLDIRECT  Physical Exam:    VS:  BP (!) 172/80 (BP Location: Right Arm, Patient Position: Sitting, Cuff Size: Normal)   Pulse (!) 57   Ht 5\' 1"  (1.549 m)   Wt 133 lb 1.9 oz (60.4 kg)   SpO2 97%   BMI 25.15 kg/m     Wt Readings from Last 3 Encounters:  07/08/18 133 lb 1.9 oz (60.4 kg)  01/06/16 136 lb (61.7 kg)     GEN:  Well nourished, well developed in no acute distress HEENT: Normal NECK: No JVD; No carotid bruits LYMPHATICS: No lymphadenopathy CARDIAC: RRR, no murmurs, rubs, gallops RESPIRATORY:  Clear to auscultation without rales, wheezing or rhonchi  ABDOMEN: Soft, non-tender, non-distended MUSCULOSKELETAL:  No edema; No deformity  SKIN: Warm and dry NEUROLOGIC:  Alert and oriented x 3 PSYCHIATRIC:  Normal affect    Signed, Morgan More, MD  07/08/2018 3:43 PM    [facility]

## 2018-08-07 ENCOUNTER — Telehealth: Payer: Self-pay | Admitting: Cardiology

## 2018-08-07 NOTE — Telephone Encounter (Signed)
° °  TELEPHONE CALL NOTE  Morgan Rosales has been deemed a candidate for a follow-up tele-health visit to limit community exposure during the Covid-19 pandemic. I spoke with the patient via phone to ensure availability of phone/video source, confirm preferred email & phone number, and discuss instructions and expectations.  I reminded Morgan Rosales to be prepared with any vital sign and/or heart rhythm information that could potentially be obtained via home monitoring, at the time of her visit. I reminded Morgan Rosales to expect an e-mail containing a link for their video-based visit approximately 15 minutes before her visit, or alternatively, a phone call at the time of her visit if her visit is planned to be a phone encounter.  STAFF MUST READ CONSENT VERBATIM TO PATIENT BELOW - Did the patient verbally consent to treatment as below? YES  Janine Limbo 08/07/2018 10:23 AM  DI understand that I have the right to withhold or withdraw my consent to the use of telemedicine in the course of my care at any time, without affecting my right to future care or treatment, and that the Practitioner or I may terminate the telemedicine visit at any time. I understand that I have the right to inspect all information obtained and/or recorded in the course of the telemedicine visit and may receive copies of available information for a reasonable fee.  I understand that some of the potential risks of receiving the Services via telemedicine include:   Delay or interruption in medical evaluation due to technological equipment failure or disruption;  Information transmitted may not be sufficient (e.g. poor resolution of images) to allow for appropriate medical decision making by the Practitioner; and/or   In rare instances, security protocols could fail, causing a breach of personal health information.  Furthermore, I acknowledge that it is my responsibility to provide information about my medical history,  conditions and care that is complete and accurate to the best of my ability. I acknowledge that Practitioner's advice, recommendations, and/or decision may be based on factors not within their control, such as incomplete or inaccurate data provided by me or distortions of diagnostic images or specimens that may result from electronic transmissions. I understand that the practice of medicine is not an exact science and that Practitioner makes no warranties or guarantees regarding treatment outcomes. I acknowledge that I will receive a copy of this consent concurrently upon execution via email to the email address I last provided but may also request a printed copy by calling the office of [facility].    I understand that my insurance will be billed for this visit.   I have read or had this consent read to me.  I understand the contents of this consent, which adequately explains the benefits and risks of the Services being provided via telemedicine.   I have been provided ample opportunity to ask questions regarding this consent and the Services and have had my questions answered to my satisfaction.  I give my informed consent for the services to be provided through the use of telemedicine in my medical care  By participating in this telemedicine visit I agree to the above.  VERBAL CONSENT RECEIVED

## 2018-08-07 NOTE — Telephone Encounter (Signed)
   Cardiac Questionnaire:    Since your last visit or hospitalization:    1. Have you been having new or worsening chest pain? No   2. Have you been having new or worsening shortness of breath? No 3. Have you been having new or worsening leg swelling, wt gain, or increase in abdominal girth (pants fitting more tightly)? No   4. Have you had any passing out spells? No    _____________   [id number] Pre-Screening Questions:  . Do you currently have a fever? No . Have you recently travelled on a cruise, internationally, or to [place], [place], [place], [place], [place], or [place], [place] [employer]) ? No . Have you been in contact with someone that is currently pending confirmation of Covid19 testing or has been confirmed to have the [place] virus?  No . Are you currently experiencing fatigue or cough? No

## 2018-08-08 ENCOUNTER — Other Ambulatory Visit: Payer: Self-pay

## 2018-08-08 ENCOUNTER — Telehealth: Payer: Medicare Other | Admitting: Cardiology

## 2018-08-08 NOTE — Progress Notes (Deleted)
Cardiology Office Note:    Date:  08/08/2018   ID:  Morgan Rosales, DOB June 14, 1932, MRN [id number]  PCP:  Nicoletta Dress, MD  Cardiologist:  Shirlee More, MD    Referring MD: Nicoletta Dress, MD    ASSESSMENT:    No diagnosis found. PLAN:    In order of problems listed above:  1. ***   Next appointment: ***   Medication Adjustments/Labs and Tests Ordered: Current medicines are reviewed at length with the patient today.  Concerns regarding medicines are outlined above.  No orders of the defined types were placed in this encounter.  No orders of the defined types were placed in this encounter.   No chief complaint on file.   History of Present Illness:    Morgan Rosales is a 85 y.o. female with a hx of *** last seen ***. Compliance with diet, lifestyle and medications: *** Past Medical History:  Diagnosis Date  . Arthritis 12/31/2016  . Atrial premature depolarization 12/31/2016  . Chest pain 12/31/2016  . Chronic kidney disease 12/31/2016  . Disease of thyroid gland 12/31/2016  . Hyperlipidemia 12/31/2016  . Hypertension   . Mild concentric left ventricular hypertrophy (LVH) 02/22/2015  . Palpitations 12/31/2016  . Paroxysmal atrial tachycardia ([place]) 12/31/2016  . Renal disorder   . Thyroid disease     Past Surgical History:  Procedure Laterality Date  . ABDOMINAL HYSTERECTOMY    . JOINT REPLACEMENT    . TOTAL KNEE ARTHROPLASTY      Current Medications: No outpatient medications have been marked as taking for the 08/08/18 encounter (Appointment) with Richardo Priest, MD.     Allergies:   Codeine; Ezetimibe; Niacin; and Sulfa antibiotics   Social History   Socioeconomic History  . Marital status: Married    Spouse name: Not on file  . Number of children: Not on file  . Years of education: Not on file  . Highest education level: Not on file  Occupational History  . Not on file  Social Needs  . Financial resource strain: Not on file  .  Food insecurity:    Worry: Not on file    Inability: Not on file  . Transportation needs:    Medical: Not on file    Non-medical: Not on file  Tobacco Use  . Smoking status: Never Smoker  . Smokeless tobacco: Never Used  Substance and Sexual Activity  . Alcohol use: No  . Drug use: No  . Sexual activity: Not on file  Lifestyle  . Physical activity:    Days per week: Not on file    Minutes per session: Not on file  . Stress: Not on file  Relationships  . Social connections:    Talks on phone: Not on file    Gets together: Not on file    Attends religious service: Not on file    Active member of club or organization: Not on file    Attends meetings of clubs or organizations: Not on file    Relationship status: Not on file  Other Topics Concern  . Not on file  Social History Narrative  . Not on file     Family History: The patient's ***family history includes Diabetes in her mother; Heart disease in her mother. ROS:   Please see the history of present illness.    All other systems reviewed and are negative.  EKGs/Labs/Other Studies Reviewed:    The following studies were reviewed today:  EKG:  EKG ordered today and personally reviewed.  The ekg ordered today demonstrates ***  Recent Labs: No results found for requested labs within last 8760 hours.  Recent Lipid Panel No results found for: CHOL, TRIG, HDL, CHOLHDL, VLDL, LDLCALC, LDLDIRECT  Physical Exam:    VS:  There were no vitals taken for this visit.    Wt Readings from Last 3 Encounters:  07/08/18 133 lb 1.9 oz (60.4 kg)  01/06/16 136 lb (61.7 kg)     GEN: *** Well nourished, well developed in no acute distress HEENT: Normal NECK: No JVD; No carotid bruits LYMPHATICS: No lymphadenopathy CARDIAC: ***RRR, no murmurs, rubs, gallops RESPIRATORY:  Clear to auscultation without rales, wheezing or rhonchi  ABDOMEN: Soft, non-tender, non-distended MUSCULOSKELETAL:  No edema; No deformity  SKIN: Warm and dry  NEUROLOGIC:  Alert and oriented x 3 PSYCHIATRIC:  Normal affect    Signed, Shirlee More, MD  08/08/2018 8:22 AM    [facility]

## 2018-10-07 DIAGNOSIS — H3562 Retinal hemorrhage, left eye: Secondary | ICD-10-CM | POA: Diagnosis not present

## 2018-10-07 DIAGNOSIS — H353112 Nonexudative age-related macular degeneration, right eye, intermediate dry stage: Secondary | ICD-10-CM | POA: Diagnosis not present

## 2018-10-09 DIAGNOSIS — H5440 Blindness, one eye, unspecified eye: Secondary | ICD-10-CM | POA: Diagnosis not present

## 2018-10-09 DIAGNOSIS — I129 Hypertensive chronic kidney disease with stage 1 through stage 4 chronic kidney disease, or unspecified chronic kidney disease: Secondary | ICD-10-CM | POA: Diagnosis not present

## 2018-10-09 DIAGNOSIS — E039 Hypothyroidism, unspecified: Secondary | ICD-10-CM | POA: Diagnosis not present

## 2018-10-09 DIAGNOSIS — E785 Hyperlipidemia, unspecified: Secondary | ICD-10-CM | POA: Diagnosis not present

## 2018-10-09 DIAGNOSIS — N184 Chronic kidney disease, stage 4 (severe): Secondary | ICD-10-CM | POA: Diagnosis not present

## 2018-10-16 DIAGNOSIS — H35033 Hypertensive retinopathy, bilateral: Secondary | ICD-10-CM | POA: Diagnosis not present

## 2018-10-16 DIAGNOSIS — H353112 Nonexudative age-related macular degeneration, right eye, intermediate dry stage: Secondary | ICD-10-CM | POA: Diagnosis not present

## 2018-10-16 DIAGNOSIS — H35372 Puckering of macula, left eye: Secondary | ICD-10-CM | POA: Diagnosis not present

## 2018-10-16 DIAGNOSIS — H353221 Exudative age-related macular degeneration, left eye, with active choroidal neovascularization: Secondary | ICD-10-CM | POA: Diagnosis not present

## 2018-10-21 DIAGNOSIS — H5440 Blindness, one eye, unspecified eye: Secondary | ICD-10-CM | POA: Diagnosis not present

## 2018-10-21 DIAGNOSIS — I6523 Occlusion and stenosis of bilateral carotid arteries: Secondary | ICD-10-CM | POA: Diagnosis not present

## 2018-11-06 DIAGNOSIS — S81811A Laceration without foreign body, right lower leg, initial encounter: Secondary | ICD-10-CM | POA: Diagnosis not present

## 2018-11-10 DIAGNOSIS — Z5189 Encounter for other specified aftercare: Secondary | ICD-10-CM | POA: Diagnosis not present

## 2018-12-11 DIAGNOSIS — H353221 Exudative age-related macular degeneration, left eye, with active choroidal neovascularization: Secondary | ICD-10-CM | POA: Diagnosis not present

## 2018-12-11 DIAGNOSIS — H353112 Nonexudative age-related macular degeneration, right eye, intermediate dry stage: Secondary | ICD-10-CM | POA: Diagnosis not present

## 2018-12-14 ENCOUNTER — Other Ambulatory Visit: Payer: Self-pay

## 2018-12-14 ENCOUNTER — Emergency Department (HOSPITAL_COMMUNITY): Payer: Medicare Other

## 2018-12-14 ENCOUNTER — Emergency Department (HOSPITAL_COMMUNITY)
Admission: EM | Admit: 2018-12-14 | Discharge: 2018-12-14 | Disposition: A | Discharge status: Home / Self Care | Payer: Medicare Other | Attending: Emergency Medicine | Admitting: Emergency Medicine

## 2018-12-14 ENCOUNTER — Encounter (HOSPITAL_COMMUNITY): Payer: Self-pay

## 2018-12-14 DIAGNOSIS — Z79899 Other long term (current) drug therapy: Secondary | ICD-10-CM | POA: Diagnosis not present

## 2018-12-14 DIAGNOSIS — Y999 Unspecified external cause status: Secondary | ICD-10-CM | POA: Insufficient documentation

## 2018-12-14 DIAGNOSIS — S81012A Laceration without foreign body, left knee, initial encounter: Secondary | ICD-10-CM | POA: Insufficient documentation

## 2018-12-14 DIAGNOSIS — Y929 Unspecified place or not applicable: Secondary | ICD-10-CM | POA: Insufficient documentation

## 2018-12-14 DIAGNOSIS — I129 Hypertensive chronic kidney disease with stage 1 through stage 4 chronic kidney disease, or unspecified chronic kidney disease: Secondary | ICD-10-CM | POA: Diagnosis not present

## 2018-12-14 DIAGNOSIS — W01198A Fall on same level from slipping, tripping and stumbling with subsequent striking against other object, initial encounter: Secondary | ICD-10-CM | POA: Insufficient documentation

## 2018-12-14 DIAGNOSIS — N189 Chronic kidney disease, unspecified: Secondary | ICD-10-CM | POA: Insufficient documentation

## 2018-12-14 DIAGNOSIS — Y939 Activity, unspecified: Secondary | ICD-10-CM | POA: Insufficient documentation

## 2018-12-14 DIAGNOSIS — S81811A Laceration without foreign body, right lower leg, initial encounter: Secondary | ICD-10-CM | POA: Diagnosis not present

## 2018-12-14 MED ORDER — LIDOCAINE-EPINEPHRINE 2 %-1:100000 IJ SOLN
20.0000 mL | Freq: Once | INTRAMUSCULAR | Status: AC
  Administered 2018-12-14: 16:00:00 20 mL via INTRADERMAL
  Filled 2018-12-14: qty 1

## 2018-12-14 NOTE — ED Notes (Signed)
Ortho called to apply knee immobilizer.

## 2018-12-14 NOTE — ED Triage Notes (Addendum)
Pt sent form urgent care for evaluation right leg after fall 2 hours ago. Reports trip and fell hitting leg on walkway apox. 6 inch lac to right lower leg denies taking blood thinners denies Loc and head injury. Wound wrapped at urgent care.

## 2018-12-14 NOTE — Discharge Instructions (Signed)
Your x-ray results were negative.  No signs of any fracture.  Apply antibiotic ointment to the wound daily, keep the wound covered.  Use the knee immobilizer to help you from bending the knee too much.  Follow-up in approximately 12-14 days have the sutures removed

## 2018-12-14 NOTE — ED Notes (Signed)
Non stick gauze and ABD pad applied to wound.

## 2018-12-14 NOTE — ED Provider Notes (Signed)
[facility] DEPT Provider Note   CSN: [id number] Arrival date & time: 12/14/18  1257    History   Chief Complaint Chief Complaint  Patient presents with  . Fall  . Laceration    HPI Morgan Rosales is a 85 y.o. female.     HPI Patient presented to the emergency room for evaluation of an extensive knee laceration.  Patient accidentally had a trip and fall today.  She fell and hit her leg on a walkway.  She sustained a laceration overlying her right kneecap.  She denies any headache or head injury.  She does have some ankle pain.  Patient went to an urgent care and they suggested she come to the ED based on the extent of her injury. Past Medical History:  Diagnosis Date  . Arthritis 12/31/2016  . Atrial premature depolarization 12/31/2016  . Chest pain 12/31/2016  . Chronic kidney disease 12/31/2016  . Disease of thyroid gland 12/31/2016  . Hyperlipidemia 12/31/2016  . Hypertension   . Mild concentric left ventricular hypertrophy (LVH) 02/22/2015  . Palpitations 12/31/2016  . Paroxysmal atrial tachycardia ([place]) 12/31/2016  . Renal disorder   . Thyroid disease     Patient Active Problem List   Diagnosis Date Noted  . Bradycardia 07/08/2018  . Arthritis 12/31/2016  . Atrial premature depolarization 12/31/2016  . Chest pain 12/31/2016  . Chronic kidney disease 12/31/2016  . Hypertension 12/31/2016  . Hyperlipidemia 12/31/2016  . Disease of thyroid gland 12/31/2016  . Palpitations 12/31/2016  . Paroxysmal atrial tachycardia ([place]) 12/31/2016  . Mild concentric left ventricular hypertrophy (LVH) 02/22/2015    Past Surgical History:  Procedure Laterality Date  . ABDOMINAL HYSTERECTOMY    . JOINT REPLACEMENT    . TOTAL KNEE ARTHROPLASTY       OB History   No obstetric history on file.      Home Medications    Prior to Admission medications   Medication Sig Start Date End Date Taking? Authorizing Provider  DULoxetine (CYMBALTA) 60 MG  capsule TAKE 1 CAPSULE BY MOUTH ONCE DAILY WITH FOOD 05/08/18   [provider]  Fluticasone Furoate 50 MCG/ACT AEPB Inhale 1 puff into the lungs daily.    [provider]  hydrALAZINE (APRESOLINE) 25 MG tablet Take 1 tablet (25 mg total) by mouth 2 (two) times daily. 07/08/18 10/06/18  Richardo Priest, MD  levothyroxine (SYNTHROID, LEVOTHROID) 88 MCG tablet Take 88 mcg by mouth daily. 01/12/15   [provider]  memantine (NAMENDA) 5 MG tablet Take 5 mg by mouth 2 (two) times daily.    [provider]  NIFEdipine (ADALAT CC) 60 MG 24 hr tablet Take 60 mg by mouth daily. 01/12/15   [provider]  Omega-3 Fatty Acids (FISH OIL) 1000 MG CAPS Take 1 capsule by mouth daily.    [provider]  pantoprazole (PROTONIX) 40 MG tablet Take 40 mg by mouth 2 (two) times daily. 01/12/15   [provider]  traMADol (ULTRAM) 50 MG tablet Take 1 tablet by mouth every 6 (six) hours as needed.    [provider]    Family History Family History  Problem Relation Age of Onset  . Diabetes Mother   . Heart disease Mother     Social History Social History   Tobacco Use  . Smoking status: Never Smoker  . Smokeless tobacco: Never Used  Substance Use Topics  . Alcohol use: No  . Drug use: No  Allergies   Codeine, Ezetimibe, Niacin, and Sulfa antibiotics   Review of Systems Review of Systems  All other systems reviewed and are negative.    Physical Exam Updated Vital Signs BP (!) 141/100   Pulse (!) 112   Temp 98 F (36.7 C) (Oral)   Resp 18   SpO2 98%   Physical Exam Vitals signs and nursing note reviewed.  Constitutional:      General: She is not in acute distress.    Appearance: She is well-developed.  HENT:     Head: Normocephalic and atraumatic.     Right Ear: External ear normal.     Left Ear: External ear normal.  Eyes:     General: No scleral icterus.       Right eye: No discharge.        Left eye: No  discharge.     Conjunctiva/sclera: Conjunctivae normal.  Neck:     Musculoskeletal: Neck supple.     Trachea: No tracheal deviation.  Cardiovascular:     Rate and Rhythm: Normal rate.  Pulmonary:     Effort: Pulmonary effort is normal. No respiratory distress.     Breath sounds: No stridor.  Abdominal:     General: There is no distension.  Musculoskeletal:        General: No swelling or deformity.     Right knee: She exhibits laceration. No tenderness found.     Right ankle: She exhibits swelling and ecchymosis. Tenderness.  Skin:    General: Skin is warm and dry.     Findings: No rash.  Neurological:     Mental Status: She is alert.     Cranial Nerves: Cranial nerve deficit: no gross deficits.      ED Treatments / Results  Labs (all labs ordered are listed, but only abnormal results are displayed) Labs Reviewed - No data to display  EKG None  Radiology Dg Tibia/fibula Right  Result Date: 12/14/2018 CLINICAL DATA:  Pt sent form urgent care for evaluation of right lower leg after a fall 2 hours ago. Pt reported she tripped and fell hitting the anterior surface of her right leg, just below her knee, on a walkway. Approximate 6 inch laceration to her right lower leg. EXAM: RIGHT TIBIA AND FIBULA - 2 VIEW COMPARISON:  None. FINDINGS: No fracture or dislocation. Components of knee arthroplasty project in expected location. Mild diffuse osteopenia. Anterior soft tissue defect below the knee. There is a small amount of subcutaneous gas laterally. No radiodense foreign body. IMPRESSION: 1. Negative for fracture, dislocation, or radiodense foreign body. 2. Anterior soft tissue defect. 3. Hardware intact. Electronically Signed   By: Lucrezia Europe M.D.   On: 12/14/2018 14:16   Dg Ankle Complete Right  Result Date: 12/14/2018 CLINICAL DATA:  Pt sent form urgent care for evaluation of right lower leg after a fall 2 hours ago. Pt reported she tripped and fell hitting the anterior surface of her  right leg, just below her knee, on a walkway. Approximate 6 inch laceration to her right lower leg.Fall EXAM: RIGHT ANKLE - COMPLETE 3+ VIEW COMPARISON:  None. FINDINGS: Ankle mortise intact. The talar dome is normal. No malleolar fracture. The calcaneus is normal. IMPRESSION: No fracture or dislocation. Electronically Signed   By: Suzy Bouchard M.D.   On: 12/14/2018 14:16    Procedures .Marland KitchenLaceration Repair  Date/Time: 12/14/2018 3:35 PM Performed by: Dorie Rank, MD Authorized by: Dorie Rank, MD   Consent:    Consent obtained:  Verbal   Consent given by:  Patient   Risks discussed:  Infection, need for additional repair, pain, poor cosmetic result and poor wound healing   Alternatives discussed:  No treatment and delayed treatment Universal protocol:    Procedure explained and questions answered to patient or proxy's satisfaction: yes     Relevant documents present and verified: yes     Test results available and properly labeled: yes     Imaging studies available: yes     Required blood products, implants, devices, and special equipment available: yes     Site/side marked: yes     Immediately prior to procedure, a time out was called: yes     Patient identity confirmed:  Verbally with patient Anesthesia (see MAR for exact dosages):    Anesthesia method:  Local infiltration   Local anesthetic:  Lidocaine 1% WITH epi Laceration details:    Location: knee.   Length (cm):  20 Repair type:    Repair type:  Complex Pre-procedure details:    Preparation:  Patient was prepped and draped in usual sterile fashion Exploration:    Hemostasis achieved with:  Direct pressure   Wound exploration: entire depth of wound probed and visualized     Contaminated: no   Treatment:    Area cleansed with:  Shur-Clens   Amount of cleaning:  Standard   Debridement:  None   Undermining:  None   Scar revision: no   Skin repair:    Repair method:  Sutures   Suture material:  Prolene (and nylon)    Suture technique:  Simple interrupted   Number of sutures:  17 Post-procedure details:    Dressing:  Adhesive bandage   Patient tolerance of procedure:  Tolerated well, no immediate complications Comments:     Dermabond added for support in a few areas.  Large flap laceration reapproximated.  Pt tolerated well   (including critical care time)  Medications Ordered in ED Medications  lidocaine-EPINEPHrine (XYLOCAINE W/EPI) 2 %-1:100000 (with pres) injection 20 mL (has no administration in time range)     Initial Impression / Assessment and Plan / ED Course  I have reviewed the triage vital signs and the nursing notes.  Pertinent labs & imaging results that were available during my care of the patient were reviewed by me and considered in my medical decision making (see chart for details).      Complex leg laceration repaired after extensive suturing.  X-rays without signs of fracture.  Plan on discharge home with a knee immobilizer.  Suture removal in 2 weeks.  Apply antibiotic ointment daily. Final Clinical Impressions(s) / ED Diagnoses   Final diagnoses:  Laceration of left knee, initial encounter    ED Discharge Orders    None       Dorie Rank, MD 12/14/18 1544

## 2018-12-29 DIAGNOSIS — S81801A Unspecified open wound, right lower leg, initial encounter: Secondary | ICD-10-CM | POA: Diagnosis not present

## 2018-12-29 DIAGNOSIS — S81811A Laceration without foreign body, right lower leg, initial encounter: Secondary | ICD-10-CM | POA: Diagnosis not present

## 2019-01-01 DIAGNOSIS — S81811A Laceration without foreign body, right lower leg, initial encounter: Secondary | ICD-10-CM | POA: Diagnosis not present

## 2019-01-15 DIAGNOSIS — H35033 Hypertensive retinopathy, bilateral: Secondary | ICD-10-CM | POA: Diagnosis not present

## 2019-01-15 DIAGNOSIS — H353112 Nonexudative age-related macular degeneration, right eye, intermediate dry stage: Secondary | ICD-10-CM | POA: Diagnosis not present

## 2019-01-15 DIAGNOSIS — H353221 Exudative age-related macular degeneration, left eye, with active choroidal neovascularization: Secondary | ICD-10-CM | POA: Diagnosis not present

## 2019-01-15 DIAGNOSIS — H35372 Puckering of macula, left eye: Secondary | ICD-10-CM | POA: Diagnosis not present

## 2019-01-28 DIAGNOSIS — Z5189 Encounter for other specified aftercare: Secondary | ICD-10-CM | POA: Diagnosis not present

## 2019-02-26 DIAGNOSIS — H353112 Nonexudative age-related macular degeneration, right eye, intermediate dry stage: Secondary | ICD-10-CM | POA: Diagnosis not present

## 2019-02-26 DIAGNOSIS — H353221 Exudative age-related macular degeneration, left eye, with active choroidal neovascularization: Secondary | ICD-10-CM | POA: Diagnosis not present

## 2019-04-02 DIAGNOSIS — Z87448 Personal history of other diseases of urinary system: Secondary | ICD-10-CM | POA: Diagnosis not present

## 2019-04-02 DIAGNOSIS — Z8639 Personal history of other endocrine, nutritional and metabolic disease: Secondary | ICD-10-CM | POA: Diagnosis not present

## 2019-04-02 DIAGNOSIS — Z79899 Other long term (current) drug therapy: Secondary | ICD-10-CM | POA: Diagnosis not present

## 2019-04-02 DIAGNOSIS — Z862 Personal history of diseases of the blood and blood-forming organs and certain disorders involving the immune mechanism: Secondary | ICD-10-CM | POA: Diagnosis not present

## 2019-04-02 DIAGNOSIS — J988 Other specified respiratory disorders: Secondary | ICD-10-CM | POA: Diagnosis not present

## 2019-04-16 DIAGNOSIS — H35371 Puckering of macula, right eye: Secondary | ICD-10-CM | POA: Diagnosis not present

## 2019-04-16 DIAGNOSIS — H353221 Exudative age-related macular degeneration, left eye, with active choroidal neovascularization: Secondary | ICD-10-CM | POA: Diagnosis not present

## 2019-04-16 DIAGNOSIS — D3131 Benign neoplasm of right choroid: Secondary | ICD-10-CM | POA: Diagnosis not present

## 2019-04-16 DIAGNOSIS — H353112 Nonexudative age-related macular degeneration, right eye, intermediate dry stage: Secondary | ICD-10-CM | POA: Diagnosis not present

## 2019-05-04 DIAGNOSIS — G8929 Other chronic pain: Secondary | ICD-10-CM | POA: Diagnosis not present

## 2019-05-04 DIAGNOSIS — M25562 Pain in left knee: Secondary | ICD-10-CM | POA: Diagnosis not present

## 2019-06-02 DIAGNOSIS — R2689 Other abnormalities of gait and mobility: Secondary | ICD-10-CM | POA: Diagnosis not present

## 2019-06-02 DIAGNOSIS — Z87898 Personal history of other specified conditions: Secondary | ICD-10-CM | POA: Diagnosis not present

## 2019-06-02 DIAGNOSIS — Z9181 History of falling: Secondary | ICD-10-CM | POA: Diagnosis not present

## 2019-06-02 DIAGNOSIS — G309 Alzheimer's disease, unspecified: Secondary | ICD-10-CM | POA: Diagnosis not present

## 2019-06-11 DIAGNOSIS — D3131 Benign neoplasm of right choroid: Secondary | ICD-10-CM | POA: Diagnosis not present

## 2019-06-11 DIAGNOSIS — H353221 Exudative age-related macular degeneration, left eye, with active choroidal neovascularization: Secondary | ICD-10-CM | POA: Diagnosis not present

## 2019-06-11 DIAGNOSIS — H35371 Puckering of macula, right eye: Secondary | ICD-10-CM | POA: Diagnosis not present

## 2019-06-11 DIAGNOSIS — H353112 Nonexudative age-related macular degeneration, right eye, intermediate dry stage: Secondary | ICD-10-CM | POA: Diagnosis not present

## 2019-07-01 DIAGNOSIS — R0781 Pleurodynia: Secondary | ICD-10-CM | POA: Diagnosis not present

## 2019-07-01 DIAGNOSIS — S79912A Unspecified injury of left hip, initial encounter: Secondary | ICD-10-CM | POA: Diagnosis not present

## 2019-07-01 DIAGNOSIS — M25552 Pain in left hip: Secondary | ICD-10-CM | POA: Diagnosis not present

## 2019-07-07 DIAGNOSIS — N184 Chronic kidney disease, stage 4 (severe): Secondary | ICD-10-CM | POA: Diagnosis not present

## 2019-07-07 DIAGNOSIS — E875 Hyperkalemia: Secondary | ICD-10-CM | POA: Diagnosis not present

## 2019-07-07 DIAGNOSIS — I129 Hypertensive chronic kidney disease with stage 1 through stage 4 chronic kidney disease, or unspecified chronic kidney disease: Secondary | ICD-10-CM | POA: Diagnosis not present

## 2019-07-27 DIAGNOSIS — G309 Alzheimer's disease, unspecified: Secondary | ICD-10-CM | POA: Diagnosis not present

## 2019-07-27 DIAGNOSIS — H3530 Unspecified macular degeneration: Secondary | ICD-10-CM | POA: Diagnosis not present

## 2019-07-27 DIAGNOSIS — R2689 Other abnormalities of gait and mobility: Secondary | ICD-10-CM | POA: Diagnosis not present

## 2019-07-27 DIAGNOSIS — Z87898 Personal history of other specified conditions: Secondary | ICD-10-CM | POA: Diagnosis not present

## 2019-08-01 DIAGNOSIS — Z9181 History of falling: Secondary | ICD-10-CM | POA: Diagnosis not present

## 2019-08-01 DIAGNOSIS — G309 Alzheimer's disease, unspecified: Secondary | ICD-10-CM | POA: Diagnosis not present

## 2019-08-01 DIAGNOSIS — I10 Essential (primary) hypertension: Secondary | ICD-10-CM | POA: Diagnosis not present

## 2019-08-01 DIAGNOSIS — M1991 Primary osteoarthritis, unspecified site: Secondary | ICD-10-CM | POA: Diagnosis not present

## 2019-08-01 DIAGNOSIS — H269 Unspecified cataract: Secondary | ICD-10-CM | POA: Diagnosis not present

## 2019-08-01 DIAGNOSIS — M5135 Other intervertebral disc degeneration, thoracolumbar region: Secondary | ICD-10-CM | POA: Diagnosis not present

## 2019-08-01 DIAGNOSIS — G629 Polyneuropathy, unspecified: Secondary | ICD-10-CM | POA: Diagnosis not present

## 2019-08-01 DIAGNOSIS — H3530 Unspecified macular degeneration: Secondary | ICD-10-CM | POA: Diagnosis not present

## 2019-08-01 DIAGNOSIS — H9193 Unspecified hearing loss, bilateral: Secondary | ICD-10-CM | POA: Diagnosis not present

## 2019-08-01 DIAGNOSIS — K219 Gastro-esophageal reflux disease without esophagitis: Secondary | ICD-10-CM | POA: Diagnosis not present

## 2019-08-01 DIAGNOSIS — E079 Disorder of thyroid, unspecified: Secondary | ICD-10-CM | POA: Diagnosis not present

## 2019-08-01 DIAGNOSIS — Z96651 Presence of right artificial knee joint: Secondary | ICD-10-CM | POA: Diagnosis not present

## 2019-08-04 DIAGNOSIS — Z96651 Presence of right artificial knee joint: Secondary | ICD-10-CM | POA: Diagnosis not present

## 2019-08-04 DIAGNOSIS — G629 Polyneuropathy, unspecified: Secondary | ICD-10-CM | POA: Diagnosis not present

## 2019-08-04 DIAGNOSIS — H9193 Unspecified hearing loss, bilateral: Secondary | ICD-10-CM | POA: Diagnosis not present

## 2019-08-04 DIAGNOSIS — G309 Alzheimer's disease, unspecified: Secondary | ICD-10-CM | POA: Diagnosis not present

## 2019-08-04 DIAGNOSIS — M1991 Primary osteoarthritis, unspecified site: Secondary | ICD-10-CM | POA: Diagnosis not present

## 2019-08-04 DIAGNOSIS — M5135 Other intervertebral disc degeneration, thoracolumbar region: Secondary | ICD-10-CM | POA: Diagnosis not present

## 2019-08-04 DIAGNOSIS — H269 Unspecified cataract: Secondary | ICD-10-CM | POA: Diagnosis not present

## 2019-08-04 DIAGNOSIS — Z9181 History of falling: Secondary | ICD-10-CM | POA: Diagnosis not present

## 2019-08-04 DIAGNOSIS — E079 Disorder of thyroid, unspecified: Secondary | ICD-10-CM | POA: Diagnosis not present

## 2019-08-04 DIAGNOSIS — K219 Gastro-esophageal reflux disease without esophagitis: Secondary | ICD-10-CM | POA: Diagnosis not present

## 2019-08-04 DIAGNOSIS — I10 Essential (primary) hypertension: Secondary | ICD-10-CM | POA: Diagnosis not present

## 2019-08-04 DIAGNOSIS — H3530 Unspecified macular degeneration: Secondary | ICD-10-CM | POA: Diagnosis not present

## 2019-08-06 DIAGNOSIS — H353221 Exudative age-related macular degeneration, left eye, with active choroidal neovascularization: Secondary | ICD-10-CM | POA: Diagnosis not present

## 2019-08-06 DIAGNOSIS — H353112 Nonexudative age-related macular degeneration, right eye, intermediate dry stage: Secondary | ICD-10-CM | POA: Diagnosis not present

## 2019-08-06 DIAGNOSIS — H35371 Puckering of macula, right eye: Secondary | ICD-10-CM | POA: Diagnosis not present

## 2019-08-07 DIAGNOSIS — K219 Gastro-esophageal reflux disease without esophagitis: Secondary | ICD-10-CM | POA: Diagnosis not present

## 2019-08-07 DIAGNOSIS — H9193 Unspecified hearing loss, bilateral: Secondary | ICD-10-CM | POA: Diagnosis not present

## 2019-08-07 DIAGNOSIS — Z96651 Presence of right artificial knee joint: Secondary | ICD-10-CM | POA: Diagnosis not present

## 2019-08-07 DIAGNOSIS — H3530 Unspecified macular degeneration: Secondary | ICD-10-CM | POA: Diagnosis not present

## 2019-08-07 DIAGNOSIS — G629 Polyneuropathy, unspecified: Secondary | ICD-10-CM | POA: Diagnosis not present

## 2019-08-07 DIAGNOSIS — I10 Essential (primary) hypertension: Secondary | ICD-10-CM | POA: Diagnosis not present

## 2019-08-07 DIAGNOSIS — E079 Disorder of thyroid, unspecified: Secondary | ICD-10-CM | POA: Diagnosis not present

## 2019-08-07 DIAGNOSIS — M1991 Primary osteoarthritis, unspecified site: Secondary | ICD-10-CM | POA: Diagnosis not present

## 2019-08-07 DIAGNOSIS — Z9181 History of falling: Secondary | ICD-10-CM | POA: Diagnosis not present

## 2019-08-07 DIAGNOSIS — M5135 Other intervertebral disc degeneration, thoracolumbar region: Secondary | ICD-10-CM | POA: Diagnosis not present

## 2019-08-07 DIAGNOSIS — G309 Alzheimer's disease, unspecified: Secondary | ICD-10-CM | POA: Diagnosis not present

## 2019-08-07 DIAGNOSIS — H269 Unspecified cataract: Secondary | ICD-10-CM | POA: Diagnosis not present

## 2019-08-10 DIAGNOSIS — E079 Disorder of thyroid, unspecified: Secondary | ICD-10-CM | POA: Diagnosis not present

## 2019-08-10 DIAGNOSIS — H3530 Unspecified macular degeneration: Secondary | ICD-10-CM | POA: Diagnosis not present

## 2019-08-10 DIAGNOSIS — M1991 Primary osteoarthritis, unspecified site: Secondary | ICD-10-CM | POA: Diagnosis not present

## 2019-08-10 DIAGNOSIS — H9193 Unspecified hearing loss, bilateral: Secondary | ICD-10-CM | POA: Diagnosis not present

## 2019-08-10 DIAGNOSIS — I10 Essential (primary) hypertension: Secondary | ICD-10-CM | POA: Diagnosis not present

## 2019-08-10 DIAGNOSIS — H269 Unspecified cataract: Secondary | ICD-10-CM | POA: Diagnosis not present

## 2019-08-10 DIAGNOSIS — Z96651 Presence of right artificial knee joint: Secondary | ICD-10-CM | POA: Diagnosis not present

## 2019-08-10 DIAGNOSIS — G309 Alzheimer's disease, unspecified: Secondary | ICD-10-CM | POA: Diagnosis not present

## 2019-08-10 DIAGNOSIS — G629 Polyneuropathy, unspecified: Secondary | ICD-10-CM | POA: Diagnosis not present

## 2019-08-10 DIAGNOSIS — Z9181 History of falling: Secondary | ICD-10-CM | POA: Diagnosis not present

## 2019-08-10 DIAGNOSIS — M5135 Other intervertebral disc degeneration, thoracolumbar region: Secondary | ICD-10-CM | POA: Diagnosis not present

## 2019-08-10 DIAGNOSIS — K219 Gastro-esophageal reflux disease without esophagitis: Secondary | ICD-10-CM | POA: Diagnosis not present

## 2019-08-11 DIAGNOSIS — G309 Alzheimer's disease, unspecified: Secondary | ICD-10-CM | POA: Diagnosis not present

## 2019-08-12 DIAGNOSIS — Z9181 History of falling: Secondary | ICD-10-CM | POA: Diagnosis not present

## 2019-08-12 DIAGNOSIS — K219 Gastro-esophageal reflux disease without esophagitis: Secondary | ICD-10-CM | POA: Diagnosis not present

## 2019-08-12 DIAGNOSIS — M1991 Primary osteoarthritis, unspecified site: Secondary | ICD-10-CM | POA: Diagnosis not present

## 2019-08-12 DIAGNOSIS — Z96651 Presence of right artificial knee joint: Secondary | ICD-10-CM | POA: Diagnosis not present

## 2019-08-12 DIAGNOSIS — H3530 Unspecified macular degeneration: Secondary | ICD-10-CM | POA: Diagnosis not present

## 2019-08-12 DIAGNOSIS — G309 Alzheimer's disease, unspecified: Secondary | ICD-10-CM | POA: Diagnosis not present

## 2019-08-12 DIAGNOSIS — M5135 Other intervertebral disc degeneration, thoracolumbar region: Secondary | ICD-10-CM | POA: Diagnosis not present

## 2019-08-12 DIAGNOSIS — H9193 Unspecified hearing loss, bilateral: Secondary | ICD-10-CM | POA: Diagnosis not present

## 2019-08-12 DIAGNOSIS — H269 Unspecified cataract: Secondary | ICD-10-CM | POA: Diagnosis not present

## 2019-08-12 DIAGNOSIS — G629 Polyneuropathy, unspecified: Secondary | ICD-10-CM | POA: Diagnosis not present

## 2019-08-12 DIAGNOSIS — E079 Disorder of thyroid, unspecified: Secondary | ICD-10-CM | POA: Diagnosis not present

## 2019-08-12 DIAGNOSIS — I10 Essential (primary) hypertension: Secondary | ICD-10-CM | POA: Diagnosis not present

## 2019-08-13 DIAGNOSIS — H3530 Unspecified macular degeneration: Secondary | ICD-10-CM | POA: Diagnosis not present

## 2019-08-13 DIAGNOSIS — Z9181 History of falling: Secondary | ICD-10-CM | POA: Diagnosis not present

## 2019-08-13 DIAGNOSIS — M5135 Other intervertebral disc degeneration, thoracolumbar region: Secondary | ICD-10-CM | POA: Diagnosis not present

## 2019-08-13 DIAGNOSIS — M1991 Primary osteoarthritis, unspecified site: Secondary | ICD-10-CM | POA: Diagnosis not present

## 2019-08-13 DIAGNOSIS — G309 Alzheimer's disease, unspecified: Secondary | ICD-10-CM | POA: Diagnosis not present

## 2019-08-13 DIAGNOSIS — H9193 Unspecified hearing loss, bilateral: Secondary | ICD-10-CM | POA: Diagnosis not present

## 2019-08-13 DIAGNOSIS — K219 Gastro-esophageal reflux disease without esophagitis: Secondary | ICD-10-CM | POA: Diagnosis not present

## 2019-08-13 DIAGNOSIS — I10 Essential (primary) hypertension: Secondary | ICD-10-CM | POA: Diagnosis not present

## 2019-08-13 DIAGNOSIS — E079 Disorder of thyroid, unspecified: Secondary | ICD-10-CM | POA: Diagnosis not present

## 2019-08-13 DIAGNOSIS — Z96651 Presence of right artificial knee joint: Secondary | ICD-10-CM | POA: Diagnosis not present

## 2019-08-13 DIAGNOSIS — G629 Polyneuropathy, unspecified: Secondary | ICD-10-CM | POA: Diagnosis not present

## 2019-08-13 DIAGNOSIS — H269 Unspecified cataract: Secondary | ICD-10-CM | POA: Diagnosis not present

## 2019-08-17 DIAGNOSIS — K219 Gastro-esophageal reflux disease without esophagitis: Secondary | ICD-10-CM | POA: Diagnosis not present

## 2019-08-17 DIAGNOSIS — H3530 Unspecified macular degeneration: Secondary | ICD-10-CM | POA: Diagnosis not present

## 2019-08-17 DIAGNOSIS — Z96651 Presence of right artificial knee joint: Secondary | ICD-10-CM | POA: Diagnosis not present

## 2019-08-17 DIAGNOSIS — M5135 Other intervertebral disc degeneration, thoracolumbar region: Secondary | ICD-10-CM | POA: Diagnosis not present

## 2019-08-17 DIAGNOSIS — G309 Alzheimer's disease, unspecified: Secondary | ICD-10-CM | POA: Diagnosis not present

## 2019-08-17 DIAGNOSIS — H269 Unspecified cataract: Secondary | ICD-10-CM | POA: Diagnosis not present

## 2019-08-17 DIAGNOSIS — E079 Disorder of thyroid, unspecified: Secondary | ICD-10-CM | POA: Diagnosis not present

## 2019-08-17 DIAGNOSIS — M1991 Primary osteoarthritis, unspecified site: Secondary | ICD-10-CM | POA: Diagnosis not present

## 2019-08-17 DIAGNOSIS — H9193 Unspecified hearing loss, bilateral: Secondary | ICD-10-CM | POA: Diagnosis not present

## 2019-08-17 DIAGNOSIS — Z9181 History of falling: Secondary | ICD-10-CM | POA: Diagnosis not present

## 2019-08-17 DIAGNOSIS — G629 Polyneuropathy, unspecified: Secondary | ICD-10-CM | POA: Diagnosis not present

## 2019-08-17 DIAGNOSIS — I10 Essential (primary) hypertension: Secondary | ICD-10-CM | POA: Diagnosis not present

## 2019-08-18 DIAGNOSIS — I10 Essential (primary) hypertension: Secondary | ICD-10-CM | POA: Diagnosis not present

## 2019-08-18 DIAGNOSIS — H9193 Unspecified hearing loss, bilateral: Secondary | ICD-10-CM | POA: Diagnosis not present

## 2019-08-18 DIAGNOSIS — H3530 Unspecified macular degeneration: Secondary | ICD-10-CM | POA: Diagnosis not present

## 2019-08-18 DIAGNOSIS — Z96651 Presence of right artificial knee joint: Secondary | ICD-10-CM | POA: Diagnosis not present

## 2019-08-18 DIAGNOSIS — Z9181 History of falling: Secondary | ICD-10-CM | POA: Diagnosis not present

## 2019-08-18 DIAGNOSIS — K219 Gastro-esophageal reflux disease without esophagitis: Secondary | ICD-10-CM | POA: Diagnosis not present

## 2019-08-18 DIAGNOSIS — E079 Disorder of thyroid, unspecified: Secondary | ICD-10-CM | POA: Diagnosis not present

## 2019-08-18 DIAGNOSIS — M5135 Other intervertebral disc degeneration, thoracolumbar region: Secondary | ICD-10-CM | POA: Diagnosis not present

## 2019-08-18 DIAGNOSIS — H269 Unspecified cataract: Secondary | ICD-10-CM | POA: Diagnosis not present

## 2019-08-18 DIAGNOSIS — G309 Alzheimer's disease, unspecified: Secondary | ICD-10-CM | POA: Diagnosis not present

## 2019-08-18 DIAGNOSIS — G629 Polyneuropathy, unspecified: Secondary | ICD-10-CM | POA: Diagnosis not present

## 2019-08-18 DIAGNOSIS — M1991 Primary osteoarthritis, unspecified site: Secondary | ICD-10-CM | POA: Diagnosis not present

## 2019-08-20 DIAGNOSIS — H9193 Unspecified hearing loss, bilateral: Secondary | ICD-10-CM | POA: Diagnosis not present

## 2019-08-20 DIAGNOSIS — M1991 Primary osteoarthritis, unspecified site: Secondary | ICD-10-CM | POA: Diagnosis not present

## 2019-08-20 DIAGNOSIS — M5135 Other intervertebral disc degeneration, thoracolumbar region: Secondary | ICD-10-CM | POA: Diagnosis not present

## 2019-08-20 DIAGNOSIS — Z96651 Presence of right artificial knee joint: Secondary | ICD-10-CM | POA: Diagnosis not present

## 2019-08-20 DIAGNOSIS — G629 Polyneuropathy, unspecified: Secondary | ICD-10-CM | POA: Diagnosis not present

## 2019-08-20 DIAGNOSIS — I10 Essential (primary) hypertension: Secondary | ICD-10-CM | POA: Diagnosis not present

## 2019-08-20 DIAGNOSIS — E079 Disorder of thyroid, unspecified: Secondary | ICD-10-CM | POA: Diagnosis not present

## 2019-08-20 DIAGNOSIS — H3530 Unspecified macular degeneration: Secondary | ICD-10-CM | POA: Diagnosis not present

## 2019-08-20 DIAGNOSIS — H269 Unspecified cataract: Secondary | ICD-10-CM | POA: Diagnosis not present

## 2019-08-20 DIAGNOSIS — Z9181 History of falling: Secondary | ICD-10-CM | POA: Diagnosis not present

## 2019-08-20 DIAGNOSIS — G309 Alzheimer's disease, unspecified: Secondary | ICD-10-CM | POA: Diagnosis not present

## 2019-08-20 DIAGNOSIS — K219 Gastro-esophageal reflux disease without esophagitis: Secondary | ICD-10-CM | POA: Diagnosis not present

## 2019-08-27 DIAGNOSIS — H3530 Unspecified macular degeneration: Secondary | ICD-10-CM | POA: Diagnosis not present

## 2019-08-27 DIAGNOSIS — I10 Essential (primary) hypertension: Secondary | ICD-10-CM | POA: Diagnosis not present

## 2019-08-27 DIAGNOSIS — G629 Polyneuropathy, unspecified: Secondary | ICD-10-CM | POA: Diagnosis not present

## 2019-08-27 DIAGNOSIS — E079 Disorder of thyroid, unspecified: Secondary | ICD-10-CM | POA: Diagnosis not present

## 2019-08-27 DIAGNOSIS — M1991 Primary osteoarthritis, unspecified site: Secondary | ICD-10-CM | POA: Diagnosis not present

## 2019-08-27 DIAGNOSIS — Z9181 History of falling: Secondary | ICD-10-CM | POA: Diagnosis not present

## 2019-08-27 DIAGNOSIS — K219 Gastro-esophageal reflux disease without esophagitis: Secondary | ICD-10-CM | POA: Diagnosis not present

## 2019-08-27 DIAGNOSIS — M5135 Other intervertebral disc degeneration, thoracolumbar region: Secondary | ICD-10-CM | POA: Diagnosis not present

## 2019-08-27 DIAGNOSIS — Z96651 Presence of right artificial knee joint: Secondary | ICD-10-CM | POA: Diagnosis not present

## 2019-08-27 DIAGNOSIS — G309 Alzheimer's disease, unspecified: Secondary | ICD-10-CM | POA: Diagnosis not present

## 2019-08-27 DIAGNOSIS — H269 Unspecified cataract: Secondary | ICD-10-CM | POA: Diagnosis not present

## 2019-08-27 DIAGNOSIS — H9193 Unspecified hearing loss, bilateral: Secondary | ICD-10-CM | POA: Diagnosis not present

## 2019-08-29 DIAGNOSIS — G309 Alzheimer's disease, unspecified: Secondary | ICD-10-CM | POA: Diagnosis not present

## 2019-08-29 DIAGNOSIS — R413 Other amnesia: Secondary | ICD-10-CM | POA: Diagnosis not present

## 2019-09-02 DIAGNOSIS — M1991 Primary osteoarthritis, unspecified site: Secondary | ICD-10-CM | POA: Diagnosis not present

## 2019-09-02 DIAGNOSIS — H3530 Unspecified macular degeneration: Secondary | ICD-10-CM | POA: Diagnosis not present

## 2019-09-02 DIAGNOSIS — H9193 Unspecified hearing loss, bilateral: Secondary | ICD-10-CM | POA: Diagnosis not present

## 2019-09-02 DIAGNOSIS — K219 Gastro-esophageal reflux disease without esophagitis: Secondary | ICD-10-CM | POA: Diagnosis not present

## 2019-09-02 DIAGNOSIS — H269 Unspecified cataract: Secondary | ICD-10-CM | POA: Diagnosis not present

## 2019-09-02 DIAGNOSIS — E079 Disorder of thyroid, unspecified: Secondary | ICD-10-CM | POA: Diagnosis not present

## 2019-09-02 DIAGNOSIS — Z9181 History of falling: Secondary | ICD-10-CM | POA: Diagnosis not present

## 2019-09-02 DIAGNOSIS — G309 Alzheimer's disease, unspecified: Secondary | ICD-10-CM | POA: Diagnosis not present

## 2019-09-02 DIAGNOSIS — G629 Polyneuropathy, unspecified: Secondary | ICD-10-CM | POA: Diagnosis not present

## 2019-09-02 DIAGNOSIS — Z96651 Presence of right artificial knee joint: Secondary | ICD-10-CM | POA: Diagnosis not present

## 2019-09-02 DIAGNOSIS — M5135 Other intervertebral disc degeneration, thoracolumbar region: Secondary | ICD-10-CM | POA: Diagnosis not present

## 2019-09-02 DIAGNOSIS — I10 Essential (primary) hypertension: Secondary | ICD-10-CM | POA: Diagnosis not present

## 2019-09-03 DIAGNOSIS — M5135 Other intervertebral disc degeneration, thoracolumbar region: Secondary | ICD-10-CM | POA: Diagnosis not present

## 2019-09-03 DIAGNOSIS — Z9181 History of falling: Secondary | ICD-10-CM | POA: Diagnosis not present

## 2019-09-03 DIAGNOSIS — G309 Alzheimer's disease, unspecified: Secondary | ICD-10-CM | POA: Diagnosis not present

## 2019-09-03 DIAGNOSIS — M1991 Primary osteoarthritis, unspecified site: Secondary | ICD-10-CM | POA: Diagnosis not present

## 2019-09-03 DIAGNOSIS — H269 Unspecified cataract: Secondary | ICD-10-CM | POA: Diagnosis not present

## 2019-09-03 DIAGNOSIS — H9193 Unspecified hearing loss, bilateral: Secondary | ICD-10-CM | POA: Diagnosis not present

## 2019-09-03 DIAGNOSIS — K219 Gastro-esophageal reflux disease without esophagitis: Secondary | ICD-10-CM | POA: Diagnosis not present

## 2019-09-03 DIAGNOSIS — H3530 Unspecified macular degeneration: Secondary | ICD-10-CM | POA: Diagnosis not present

## 2019-09-03 DIAGNOSIS — G629 Polyneuropathy, unspecified: Secondary | ICD-10-CM | POA: Diagnosis not present

## 2019-09-03 DIAGNOSIS — Z96651 Presence of right artificial knee joint: Secondary | ICD-10-CM | POA: Diagnosis not present

## 2019-09-03 DIAGNOSIS — E079 Disorder of thyroid, unspecified: Secondary | ICD-10-CM | POA: Diagnosis not present

## 2019-09-03 DIAGNOSIS — I10 Essential (primary) hypertension: Secondary | ICD-10-CM | POA: Diagnosis not present

## 2019-09-04 DIAGNOSIS — E079 Disorder of thyroid, unspecified: Secondary | ICD-10-CM | POA: Diagnosis not present

## 2019-09-04 DIAGNOSIS — H9193 Unspecified hearing loss, bilateral: Secondary | ICD-10-CM | POA: Diagnosis not present

## 2019-09-04 DIAGNOSIS — M1991 Primary osteoarthritis, unspecified site: Secondary | ICD-10-CM | POA: Diagnosis not present

## 2019-09-04 DIAGNOSIS — Z9181 History of falling: Secondary | ICD-10-CM | POA: Diagnosis not present

## 2019-09-04 DIAGNOSIS — K219 Gastro-esophageal reflux disease without esophagitis: Secondary | ICD-10-CM | POA: Diagnosis not present

## 2019-09-04 DIAGNOSIS — H269 Unspecified cataract: Secondary | ICD-10-CM | POA: Diagnosis not present

## 2019-09-04 DIAGNOSIS — H3530 Unspecified macular degeneration: Secondary | ICD-10-CM | POA: Diagnosis not present

## 2019-09-04 DIAGNOSIS — Z96651 Presence of right artificial knee joint: Secondary | ICD-10-CM | POA: Diagnosis not present

## 2019-09-04 DIAGNOSIS — I10 Essential (primary) hypertension: Secondary | ICD-10-CM | POA: Diagnosis not present

## 2019-09-04 DIAGNOSIS — M5135 Other intervertebral disc degeneration, thoracolumbar region: Secondary | ICD-10-CM | POA: Diagnosis not present

## 2019-09-04 DIAGNOSIS — G629 Polyneuropathy, unspecified: Secondary | ICD-10-CM | POA: Diagnosis not present

## 2019-09-04 DIAGNOSIS — G309 Alzheimer's disease, unspecified: Secondary | ICD-10-CM | POA: Diagnosis not present

## 2019-09-07 DIAGNOSIS — G629 Polyneuropathy, unspecified: Secondary | ICD-10-CM | POA: Diagnosis not present

## 2019-09-07 DIAGNOSIS — M5135 Other intervertebral disc degeneration, thoracolumbar region: Secondary | ICD-10-CM | POA: Diagnosis not present

## 2019-09-07 DIAGNOSIS — H269 Unspecified cataract: Secondary | ICD-10-CM | POA: Diagnosis not present

## 2019-09-07 DIAGNOSIS — G309 Alzheimer's disease, unspecified: Secondary | ICD-10-CM | POA: Diagnosis not present

## 2019-09-07 DIAGNOSIS — M1991 Primary osteoarthritis, unspecified site: Secondary | ICD-10-CM | POA: Diagnosis not present

## 2019-09-07 DIAGNOSIS — K219 Gastro-esophageal reflux disease without esophagitis: Secondary | ICD-10-CM | POA: Diagnosis not present

## 2019-09-07 DIAGNOSIS — H3530 Unspecified macular degeneration: Secondary | ICD-10-CM | POA: Diagnosis not present

## 2019-09-07 DIAGNOSIS — I10 Essential (primary) hypertension: Secondary | ICD-10-CM | POA: Diagnosis not present

## 2019-09-07 DIAGNOSIS — Z9181 History of falling: Secondary | ICD-10-CM | POA: Diagnosis not present

## 2019-09-07 DIAGNOSIS — H9193 Unspecified hearing loss, bilateral: Secondary | ICD-10-CM | POA: Diagnosis not present

## 2019-09-07 DIAGNOSIS — Z96651 Presence of right artificial knee joint: Secondary | ICD-10-CM | POA: Diagnosis not present

## 2019-09-07 DIAGNOSIS — E079 Disorder of thyroid, unspecified: Secondary | ICD-10-CM | POA: Diagnosis not present

## 2019-09-10 DIAGNOSIS — H9193 Unspecified hearing loss, bilateral: Secondary | ICD-10-CM | POA: Diagnosis not present

## 2019-09-10 DIAGNOSIS — K219 Gastro-esophageal reflux disease without esophagitis: Secondary | ICD-10-CM | POA: Diagnosis not present

## 2019-09-10 DIAGNOSIS — H3530 Unspecified macular degeneration: Secondary | ICD-10-CM | POA: Diagnosis not present

## 2019-09-10 DIAGNOSIS — M5135 Other intervertebral disc degeneration, thoracolumbar region: Secondary | ICD-10-CM | POA: Diagnosis not present

## 2019-09-10 DIAGNOSIS — H269 Unspecified cataract: Secondary | ICD-10-CM | POA: Diagnosis not present

## 2019-09-10 DIAGNOSIS — G629 Polyneuropathy, unspecified: Secondary | ICD-10-CM | POA: Diagnosis not present

## 2019-09-10 DIAGNOSIS — G309 Alzheimer's disease, unspecified: Secondary | ICD-10-CM | POA: Diagnosis not present

## 2019-09-10 DIAGNOSIS — E079 Disorder of thyroid, unspecified: Secondary | ICD-10-CM | POA: Diagnosis not present

## 2019-09-10 DIAGNOSIS — Z96651 Presence of right artificial knee joint: Secondary | ICD-10-CM | POA: Diagnosis not present

## 2019-09-10 DIAGNOSIS — M1991 Primary osteoarthritis, unspecified site: Secondary | ICD-10-CM | POA: Diagnosis not present

## 2019-09-10 DIAGNOSIS — I10 Essential (primary) hypertension: Secondary | ICD-10-CM | POA: Diagnosis not present

## 2019-09-10 DIAGNOSIS — Z9181 History of falling: Secondary | ICD-10-CM | POA: Diagnosis not present

## 2019-09-15 DIAGNOSIS — G629 Polyneuropathy, unspecified: Secondary | ICD-10-CM | POA: Diagnosis not present

## 2019-09-15 DIAGNOSIS — Z9181 History of falling: Secondary | ICD-10-CM | POA: Diagnosis not present

## 2019-09-15 DIAGNOSIS — Z96651 Presence of right artificial knee joint: Secondary | ICD-10-CM | POA: Diagnosis not present

## 2019-09-15 DIAGNOSIS — K219 Gastro-esophageal reflux disease without esophagitis: Secondary | ICD-10-CM | POA: Diagnosis not present

## 2019-09-15 DIAGNOSIS — G309 Alzheimer's disease, unspecified: Secondary | ICD-10-CM | POA: Diagnosis not present

## 2019-09-15 DIAGNOSIS — I10 Essential (primary) hypertension: Secondary | ICD-10-CM | POA: Diagnosis not present

## 2019-09-15 DIAGNOSIS — H269 Unspecified cataract: Secondary | ICD-10-CM | POA: Diagnosis not present

## 2019-09-15 DIAGNOSIS — H3530 Unspecified macular degeneration: Secondary | ICD-10-CM | POA: Diagnosis not present

## 2019-09-15 DIAGNOSIS — H9193 Unspecified hearing loss, bilateral: Secondary | ICD-10-CM | POA: Diagnosis not present

## 2019-09-15 DIAGNOSIS — E079 Disorder of thyroid, unspecified: Secondary | ICD-10-CM | POA: Diagnosis not present

## 2019-09-15 DIAGNOSIS — M5135 Other intervertebral disc degeneration, thoracolumbar region: Secondary | ICD-10-CM | POA: Diagnosis not present

## 2019-09-15 DIAGNOSIS — M1991 Primary osteoarthritis, unspecified site: Secondary | ICD-10-CM | POA: Diagnosis not present

## 2019-09-17 DIAGNOSIS — G309 Alzheimer's disease, unspecified: Secondary | ICD-10-CM | POA: Diagnosis not present

## 2019-09-17 DIAGNOSIS — M1991 Primary osteoarthritis, unspecified site: Secondary | ICD-10-CM | POA: Diagnosis not present

## 2019-09-17 DIAGNOSIS — G629 Polyneuropathy, unspecified: Secondary | ICD-10-CM | POA: Diagnosis not present

## 2019-09-17 DIAGNOSIS — H3530 Unspecified macular degeneration: Secondary | ICD-10-CM | POA: Diagnosis not present

## 2019-09-17 DIAGNOSIS — Z9181 History of falling: Secondary | ICD-10-CM | POA: Diagnosis not present

## 2019-09-17 DIAGNOSIS — Z96651 Presence of right artificial knee joint: Secondary | ICD-10-CM | POA: Diagnosis not present

## 2019-09-17 DIAGNOSIS — H9193 Unspecified hearing loss, bilateral: Secondary | ICD-10-CM | POA: Diagnosis not present

## 2019-09-17 DIAGNOSIS — I10 Essential (primary) hypertension: Secondary | ICD-10-CM | POA: Diagnosis not present

## 2019-09-17 DIAGNOSIS — M5135 Other intervertebral disc degeneration, thoracolumbar region: Secondary | ICD-10-CM | POA: Diagnosis not present

## 2019-09-17 DIAGNOSIS — H269 Unspecified cataract: Secondary | ICD-10-CM | POA: Diagnosis not present

## 2019-09-17 DIAGNOSIS — K219 Gastro-esophageal reflux disease without esophagitis: Secondary | ICD-10-CM | POA: Diagnosis not present

## 2019-09-17 DIAGNOSIS — E079 Disorder of thyroid, unspecified: Secondary | ICD-10-CM | POA: Diagnosis not present

## 2019-09-21 DIAGNOSIS — G309 Alzheimer's disease, unspecified: Secondary | ICD-10-CM | POA: Diagnosis not present

## 2019-09-21 DIAGNOSIS — Z96651 Presence of right artificial knee joint: Secondary | ICD-10-CM | POA: Diagnosis not present

## 2019-09-21 DIAGNOSIS — H269 Unspecified cataract: Secondary | ICD-10-CM | POA: Diagnosis not present

## 2019-09-21 DIAGNOSIS — M25562 Pain in left knee: Secondary | ICD-10-CM | POA: Diagnosis not present

## 2019-09-21 DIAGNOSIS — M1991 Primary osteoarthritis, unspecified site: Secondary | ICD-10-CM | POA: Diagnosis not present

## 2019-09-21 DIAGNOSIS — H3530 Unspecified macular degeneration: Secondary | ICD-10-CM | POA: Diagnosis not present

## 2019-09-21 DIAGNOSIS — I10 Essential (primary) hypertension: Secondary | ICD-10-CM | POA: Diagnosis not present

## 2019-09-21 DIAGNOSIS — Z9181 History of falling: Secondary | ICD-10-CM | POA: Diagnosis not present

## 2019-09-21 DIAGNOSIS — H9193 Unspecified hearing loss, bilateral: Secondary | ICD-10-CM | POA: Diagnosis not present

## 2019-09-21 DIAGNOSIS — K219 Gastro-esophageal reflux disease without esophagitis: Secondary | ICD-10-CM | POA: Diagnosis not present

## 2019-09-21 DIAGNOSIS — M5135 Other intervertebral disc degeneration, thoracolumbar region: Secondary | ICD-10-CM | POA: Diagnosis not present

## 2019-09-21 DIAGNOSIS — G8929 Other chronic pain: Secondary | ICD-10-CM | POA: Diagnosis not present

## 2019-09-21 DIAGNOSIS — E079 Disorder of thyroid, unspecified: Secondary | ICD-10-CM | POA: Diagnosis not present

## 2019-09-21 DIAGNOSIS — G629 Polyneuropathy, unspecified: Secondary | ICD-10-CM | POA: Diagnosis not present

## 2019-09-25 DIAGNOSIS — M5135 Other intervertebral disc degeneration, thoracolumbar region: Secondary | ICD-10-CM | POA: Diagnosis not present

## 2019-09-25 DIAGNOSIS — G629 Polyneuropathy, unspecified: Secondary | ICD-10-CM | POA: Diagnosis not present

## 2019-09-25 DIAGNOSIS — H269 Unspecified cataract: Secondary | ICD-10-CM | POA: Diagnosis not present

## 2019-09-25 DIAGNOSIS — H3530 Unspecified macular degeneration: Secondary | ICD-10-CM | POA: Diagnosis not present

## 2019-09-25 DIAGNOSIS — I10 Essential (primary) hypertension: Secondary | ICD-10-CM | POA: Diagnosis not present

## 2019-09-25 DIAGNOSIS — G309 Alzheimer's disease, unspecified: Secondary | ICD-10-CM | POA: Diagnosis not present

## 2019-09-25 DIAGNOSIS — Z96651 Presence of right artificial knee joint: Secondary | ICD-10-CM | POA: Diagnosis not present

## 2019-09-25 DIAGNOSIS — E079 Disorder of thyroid, unspecified: Secondary | ICD-10-CM | POA: Diagnosis not present

## 2019-09-25 DIAGNOSIS — H9193 Unspecified hearing loss, bilateral: Secondary | ICD-10-CM | POA: Diagnosis not present

## 2019-09-25 DIAGNOSIS — M1991 Primary osteoarthritis, unspecified site: Secondary | ICD-10-CM | POA: Diagnosis not present

## 2019-09-25 DIAGNOSIS — Z9181 History of falling: Secondary | ICD-10-CM | POA: Diagnosis not present

## 2019-09-25 DIAGNOSIS — K219 Gastro-esophageal reflux disease without esophagitis: Secondary | ICD-10-CM | POA: Diagnosis not present

## 2019-10-07 DIAGNOSIS — B372 Candidiasis of skin and nail: Secondary | ICD-10-CM | POA: Diagnosis not present

## 2019-10-15 DIAGNOSIS — H35371 Puckering of macula, right eye: Secondary | ICD-10-CM | POA: Diagnosis not present

## 2019-10-15 DIAGNOSIS — H353221 Exudative age-related macular degeneration, left eye, with active choroidal neovascularization: Secondary | ICD-10-CM | POA: Diagnosis not present

## 2019-10-15 DIAGNOSIS — H353112 Nonexudative age-related macular degeneration, right eye, intermediate dry stage: Secondary | ICD-10-CM | POA: Diagnosis not present

## 2019-10-15 DIAGNOSIS — D3131 Benign neoplasm of right choroid: Secondary | ICD-10-CM | POA: Diagnosis not present

## 2019-11-03 DIAGNOSIS — G309 Alzheimer's disease, unspecified: Secondary | ICD-10-CM | POA: Diagnosis not present

## 2019-11-18 DIAGNOSIS — R300 Dysuria: Secondary | ICD-10-CM | POA: Diagnosis not present

## 2019-11-20 DIAGNOSIS — R410 Disorientation, unspecified: Secondary | ICD-10-CM | POA: Diagnosis not present

## 2019-12-18 DIAGNOSIS — Z87448 Personal history of other diseases of urinary system: Secondary | ICD-10-CM | POA: Diagnosis not present

## 2019-12-18 DIAGNOSIS — Z8639 Personal history of other endocrine, nutritional and metabolic disease: Secondary | ICD-10-CM | POA: Diagnosis not present

## 2020-01-07 DIAGNOSIS — H353112 Nonexudative age-related macular degeneration, right eye, intermediate dry stage: Secondary | ICD-10-CM | POA: Diagnosis not present

## 2020-01-07 DIAGNOSIS — H35371 Puckering of macula, right eye: Secondary | ICD-10-CM | POA: Diagnosis not present

## 2020-01-07 DIAGNOSIS — H353221 Exudative age-related macular degeneration, left eye, with active choroidal neovascularization: Secondary | ICD-10-CM | POA: Diagnosis not present

## 2020-01-07 DIAGNOSIS — D3131 Benign neoplasm of right choroid: Secondary | ICD-10-CM | POA: Diagnosis not present

## 2020-02-15 DIAGNOSIS — H01009 Unspecified blepharitis unspecified eye, unspecified eyelid: Secondary | ICD-10-CM | POA: Diagnosis not present

## 2020-02-15 DIAGNOSIS — H353230 Exudative age-related macular degeneration, bilateral, stage unspecified: Secondary | ICD-10-CM | POA: Diagnosis not present

## 2020-02-22 DIAGNOSIS — G8929 Other chronic pain: Secondary | ICD-10-CM | POA: Diagnosis not present

## 2020-02-22 DIAGNOSIS — M25562 Pain in left knee: Secondary | ICD-10-CM | POA: Diagnosis not present

## 2020-02-25 DIAGNOSIS — H353211 Exudative age-related macular degeneration, right eye, with active choroidal neovascularization: Secondary | ICD-10-CM | POA: Diagnosis not present

## 2020-02-25 DIAGNOSIS — H353231 Exudative age-related macular degeneration, bilateral, with active choroidal neovascularization: Secondary | ICD-10-CM | POA: Diagnosis not present

## 2020-03-09 DIAGNOSIS — R2689 Other abnormalities of gait and mobility: Secondary | ICD-10-CM | POA: Diagnosis not present

## 2020-03-09 DIAGNOSIS — H3530 Unspecified macular degeneration: Secondary | ICD-10-CM | POA: Diagnosis not present

## 2020-03-09 DIAGNOSIS — Z0000 Encounter for general adult medical examination without abnormal findings: Secondary | ICD-10-CM | POA: Diagnosis not present

## 2020-03-09 DIAGNOSIS — Z79899 Other long term (current) drug therapy: Secondary | ICD-10-CM | POA: Diagnosis not present

## 2020-03-09 DIAGNOSIS — R5383 Other fatigue: Secondary | ICD-10-CM | POA: Diagnosis not present

## 2020-03-09 DIAGNOSIS — M1990 Unspecified osteoarthritis, unspecified site: Secondary | ICD-10-CM | POA: Diagnosis not present

## 2020-03-09 DIAGNOSIS — G309 Alzheimer's disease, unspecified: Secondary | ICD-10-CM | POA: Diagnosis not present

## 2020-03-09 DIAGNOSIS — Z8639 Personal history of other endocrine, nutritional and metabolic disease: Secondary | ICD-10-CM | POA: Diagnosis not present

## 2020-03-09 DIAGNOSIS — N184 Chronic kidney disease, stage 4 (severe): Secondary | ICD-10-CM | POA: Diagnosis not present

## 2020-03-09 DIAGNOSIS — Z0184 Encounter for antibody response examination: Secondary | ICD-10-CM | POA: Diagnosis not present

## 2020-08-12 DEATH — deceased

## 2020-12-04 IMAGING — CR RIGHT ANKLE - COMPLETE 3+ VIEW
3 series · 3 of 3 positions shown · non-contrast
Comparison: None.

CLINICAL DATA: Pt sent form [HOSPITAL] for evaluation of right
lower leg after a fall 2 hours ago. Pt reported she tripped and fell
hitting the anterior surface of her right leg, just below her knee,
on a walkway. Approximate 6 inch laceration to her right lower
leg.Fall

EXAM:
RIGHT ANKLE - COMPLETE 3+ VIEW

[x ankle ap right]
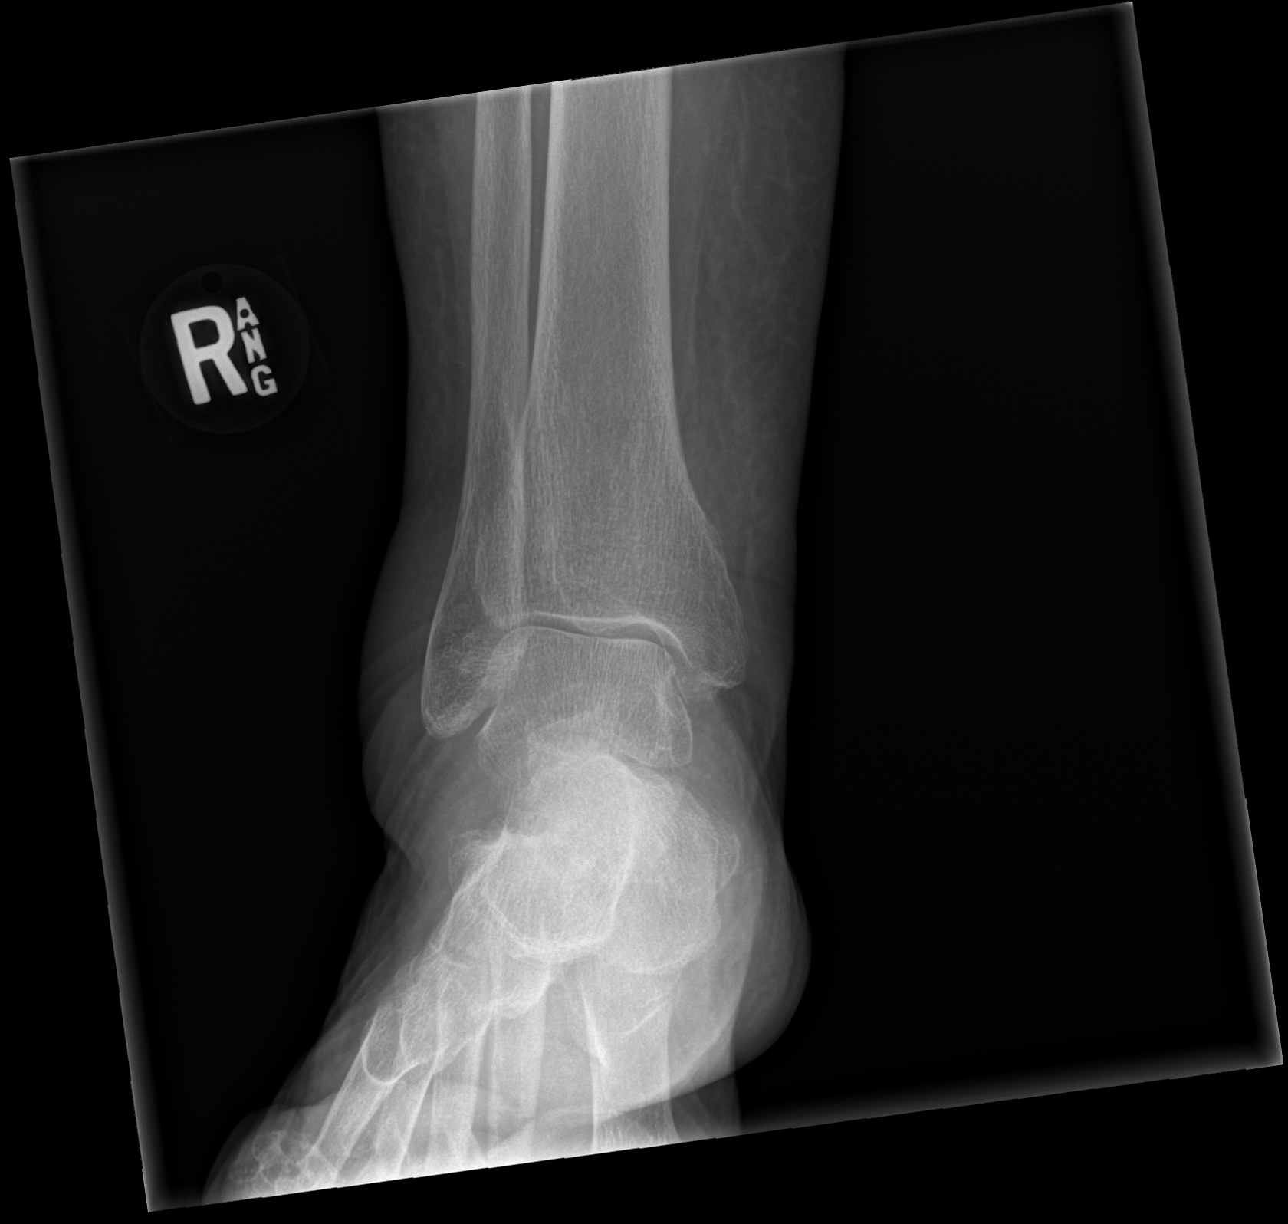

[x ankle obl right]
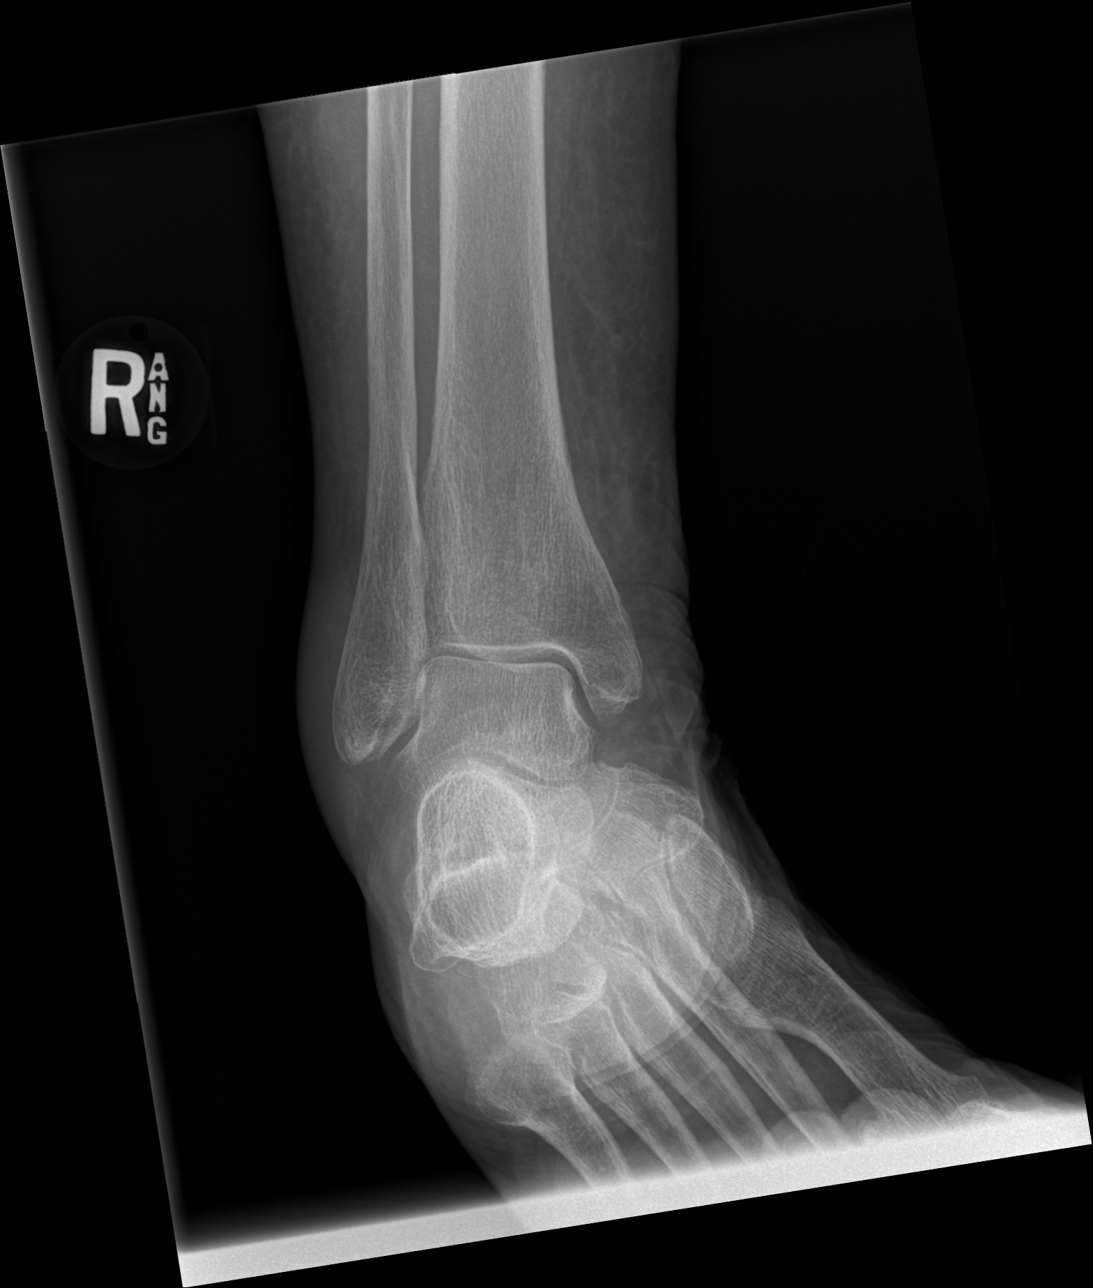

[x ankle lat right]
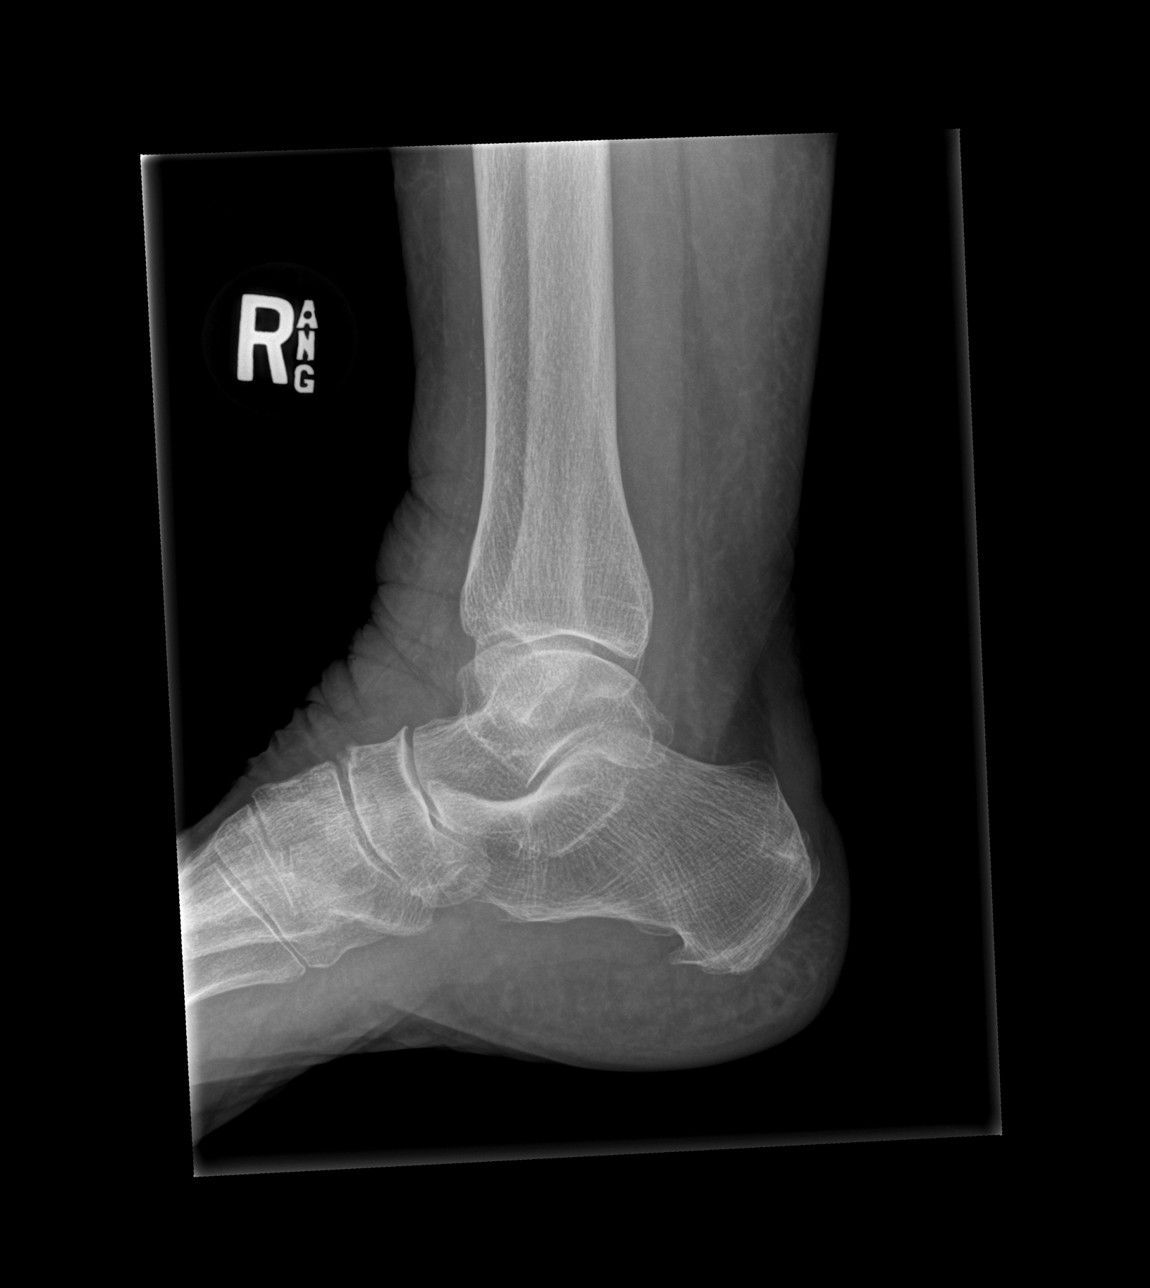

[3 of 3 positions shown; findings below may reference images not displayed]

FINDINGS: Ankle mortise intact. The talar dome is normal. No malleolar
fracture. The calcaneus is normal.
IMPRESSION: No fracture or dislocation.

## 2020-12-04 IMAGING — CR RIGHT TIBIA AND FIBULA - 2 VIEW
4 series · 4 of 4 positions shown · non-contrast
Comparison: None.

CLINICAL DATA: Pt sent form [HOSPITAL] for evaluation of right
lower leg after a fall 2 hours ago. Pt reported she tripped and fell
hitting the anterior surface of her right leg, just below her knee,
on a walkway. Approximate 6 inch laceration to her right lower leg.

EXAM:
RIGHT TIBIA AND FIBULA - 2 VIEW

[x tib-fib ap right (1 of 2)]
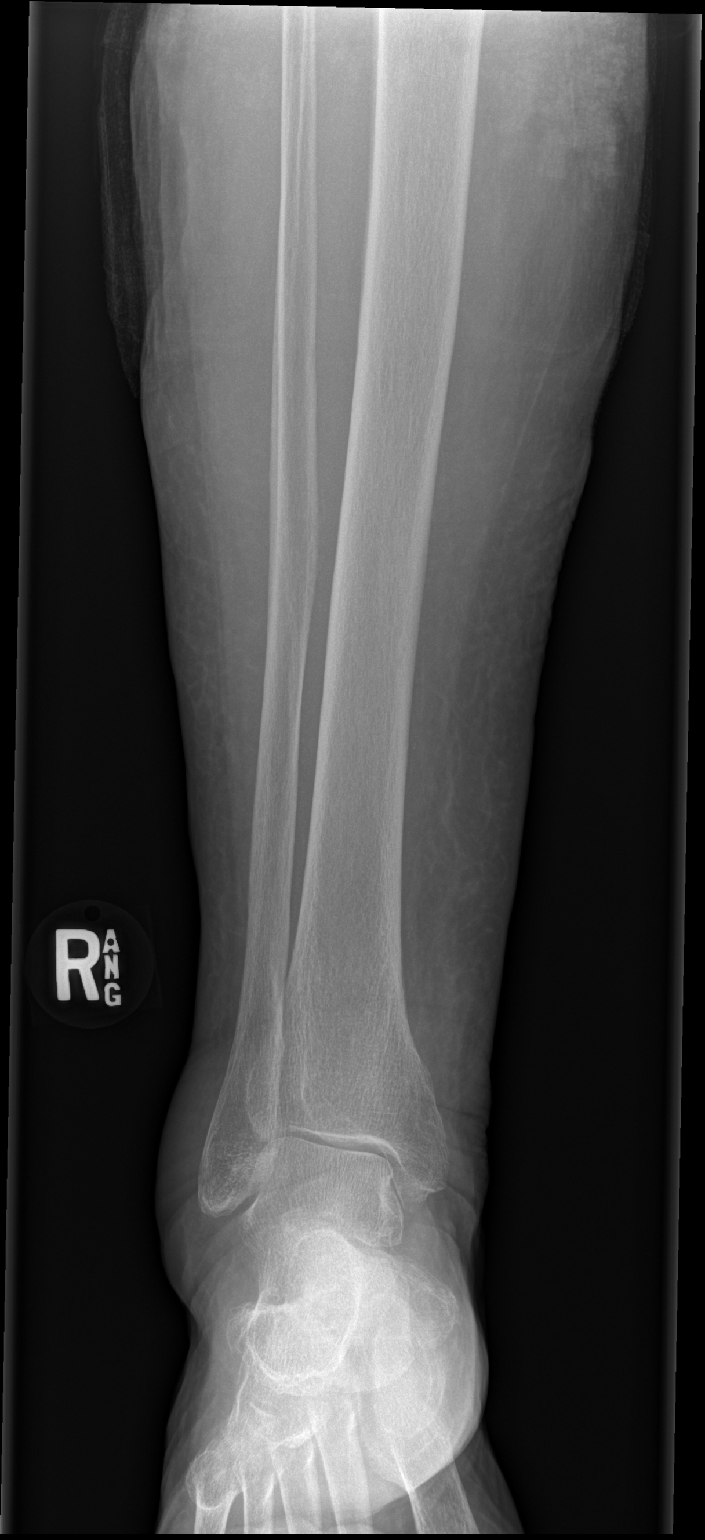

[x tib-fib ap right (2 of 2)]
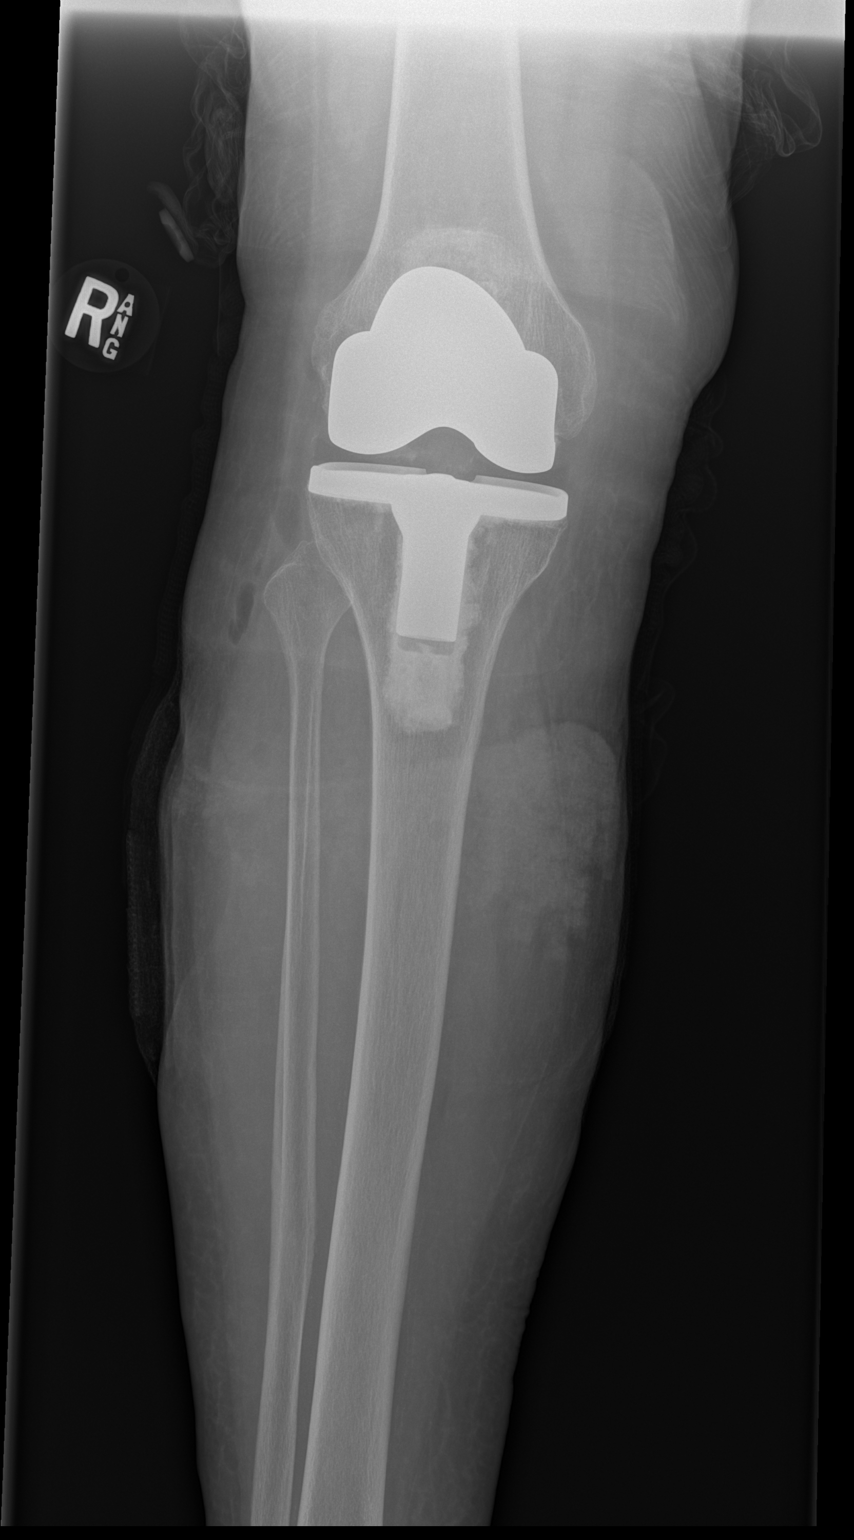

[x tib-fib lat right (1 of 2)]
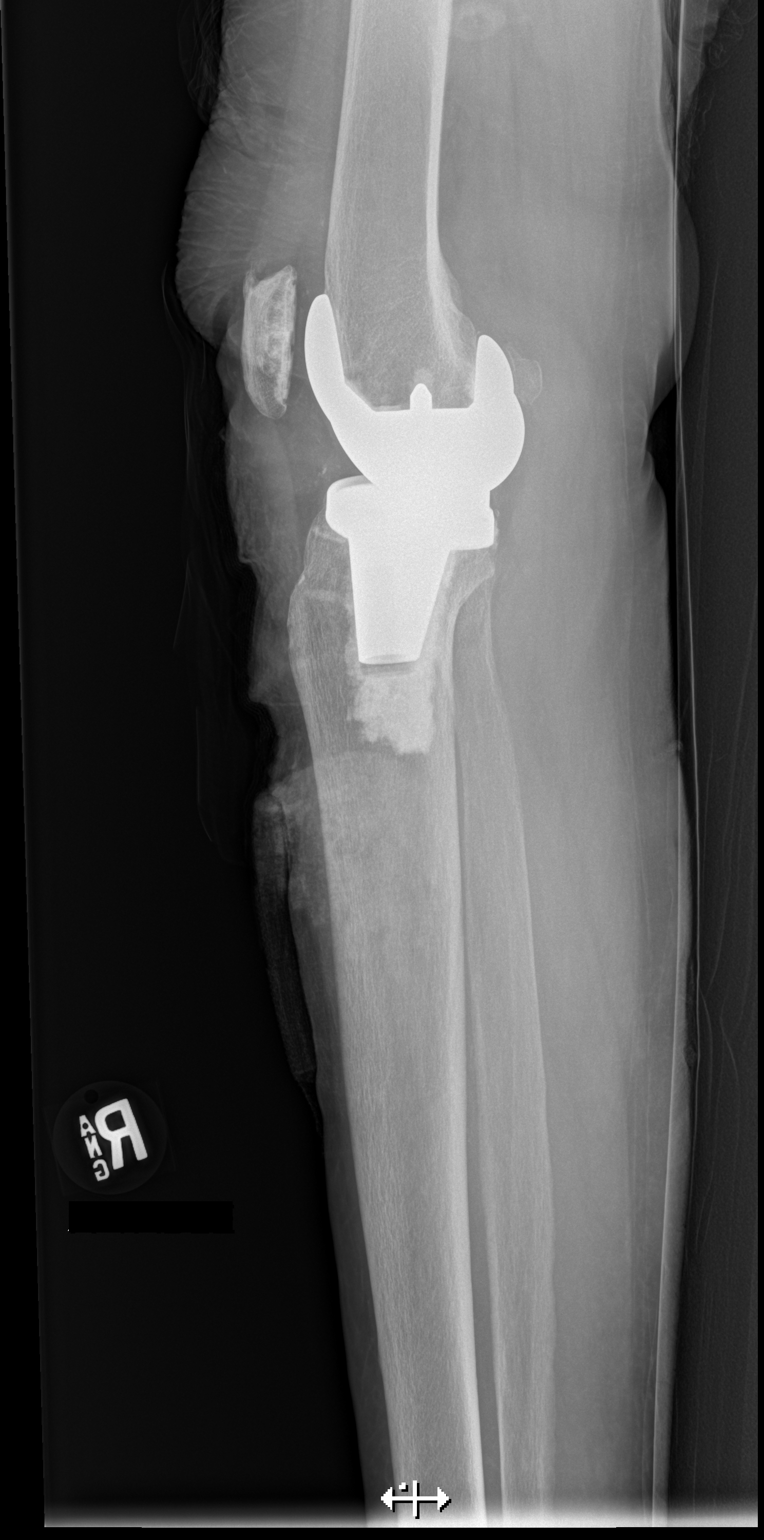

[x tib-fib lat right (2 of 2)]
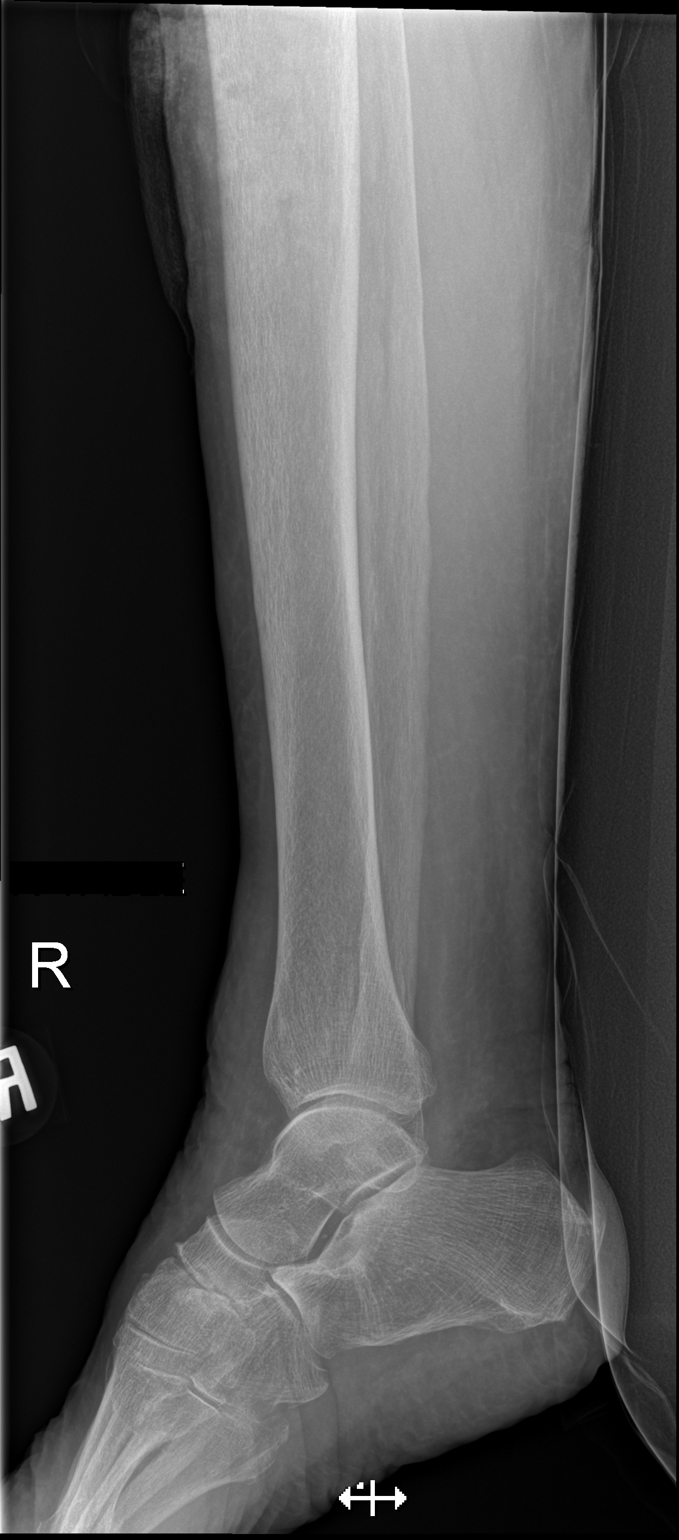

[4 of 4 positions shown; findings below may reference images not displayed]

FINDINGS: No fracture or dislocation. Components of knee arthroplasty project
in expected location. Mild diffuse osteopenia. Anterior soft tissue
defect below the knee. There is a small amount of subcutaneous gas
laterally. No radiodense foreign body.
IMPRESSION: 1. Negative for fracture, dislocation, or radiodense foreign body.
2. Anterior soft tissue defect.
3. Hardware intact.
